# Patient Record
Sex: Female | Born: 1945 | Race: White | Hispanic: No | Marital: Married | Smoking: Former smoker
Health system: Southern US, Community
[De-identification: ages and names within clinical notes are randomized; demographics above are authoritative.]

## PROBLEM LIST (undated history)

## (undated) DIAGNOSIS — I10 Essential (primary) hypertension: Secondary | ICD-10-CM

## (undated) DIAGNOSIS — K219 Gastro-esophageal reflux disease without esophagitis: Secondary | ICD-10-CM

## (undated) DIAGNOSIS — F419 Anxiety disorder, unspecified: Secondary | ICD-10-CM

## (undated) DIAGNOSIS — Z720 Tobacco use: Secondary | ICD-10-CM

## (undated) DIAGNOSIS — J45909 Unspecified asthma, uncomplicated: Secondary | ICD-10-CM

## (undated) DIAGNOSIS — E039 Hypothyroidism, unspecified: Secondary | ICD-10-CM

## (undated) DIAGNOSIS — N289 Disorder of kidney and ureter, unspecified: Secondary | ICD-10-CM

## (undated) DIAGNOSIS — M1990 Unspecified osteoarthritis, unspecified site: Secondary | ICD-10-CM

## (undated) DIAGNOSIS — M109 Gout, unspecified: Secondary | ICD-10-CM

## (undated) HISTORY — PX: TUBAL LIGATION: SHX77

## (undated) HISTORY — DX: Unspecified osteoarthritis, unspecified site: M19.90

## (undated) HISTORY — DX: Tobacco use: Z72.0

## (undated) HISTORY — DX: Unspecified asthma, uncomplicated: J45.909

## (undated) HISTORY — DX: Gastro-esophageal reflux disease without esophagitis: K21.9

## (undated) HISTORY — DX: Essential (primary) hypertension: I10

## (undated) HISTORY — DX: Disorder of kidney and ureter, unspecified: N28.9

## (undated) HISTORY — DX: Anxiety disorder, unspecified: F41.9

## (undated) HISTORY — DX: Gout, unspecified: M10.9

## (undated) HISTORY — DX: Hypothyroidism, unspecified: E03.9

---

## 1986-01-05 HISTORY — PX: OTHER SURGICAL HISTORY: SHX169

## 1990-01-05 HISTORY — PX: SINOSCOPY: SHX187

## 2015-04-02 ENCOUNTER — Telehealth: Payer: Self-pay | Admitting: Allergy and Immunology

## 2015-04-02 NOTE — Telephone Encounter (Signed)
Patient is at home phone until 3:30 today, if after that you will have to call cell phone. Patient has an appointment next week and has an insurance question before she attends her appointment.

## 2015-04-02 NOTE — Telephone Encounter (Signed)
Pt has [employer] - they told her they would pay for testing 100% - i told her to check on her ded

## 2015-04-10 ENCOUNTER — Ambulatory Visit (INDEPENDENT_AMBULATORY_CARE_PROVIDER_SITE_OTHER): Payer: PRIVATE HEALTH INSURANCE | Admitting: Allergy and Immunology

## 2015-04-10 ENCOUNTER — Encounter: Payer: Self-pay | Admitting: Allergy and Immunology

## 2015-04-10 VITALS — BP 122/80 | HR 88 | Temp 98.7°F | Resp 20 | Ht 62.48 in | Wt 170.6 lb

## 2015-04-10 DIAGNOSIS — Z720 Tobacco use: Secondary | ICD-10-CM | POA: Diagnosis not present

## 2015-04-10 DIAGNOSIS — K219 Gastro-esophageal reflux disease without esophagitis: Secondary | ICD-10-CM

## 2015-04-10 DIAGNOSIS — J45909 Unspecified asthma, uncomplicated: Secondary | ICD-10-CM

## 2015-04-10 DIAGNOSIS — J309 Allergic rhinitis, unspecified: Secondary | ICD-10-CM

## 2015-04-10 DIAGNOSIS — H1010 Acute atopic conjunctivitis, unspecified eye: Secondary | ICD-10-CM | POA: Diagnosis not present

## 2015-04-10 DIAGNOSIS — J387 Other diseases of larynx: Secondary | ICD-10-CM | POA: Diagnosis not present

## 2015-04-10 DIAGNOSIS — J449 Chronic obstructive pulmonary disease, unspecified: Secondary | ICD-10-CM | POA: Diagnosis not present

## 2015-04-10 DIAGNOSIS — F17210 Nicotine dependence, cigarettes, uncomplicated: Secondary | ICD-10-CM

## 2015-04-10 MED ORDER — FLUTICASONE FUROATE 100 MCG/ACT IN AEPB: 1.0000 | INHALATION_SPRAY | Freq: Every day | RESPIRATORY_TRACT | Status: DC

## 2015-04-10 MED ORDER — DEXLANSOPRAZOLE 60 MG PO CPDR: DELAYED_RELEASE_CAPSULE | ORAL | Status: DC

## 2015-04-10 NOTE — Progress Notes (Signed)
Dear Sherlyn Lees,  Thank you for referring Janet Dean to the [facility] on 04/10/2015.   Below is a summation of this patient's evaluation and recommendations.  Thank you for your referral. I will keep you informed about this patient's response to treatment.   If you have any questions please to do hestitate to contact me.   Sincerely,  Jiles Prows, MD [place] [facility]   ______________________________________________________________________    NEW PATIENT NOTE  Referring Provider: Sherlyn Lees, FNP Primary Provider: Sherlyn Lees, [place] Date of office visit: 04/10/2015    Subjective:   Chief Complaint:  Janet Dean (DOB: 08/14/1945) is a 70 y.o. female with a chief complaint of Asthma and Nasal Congestion  who presents to the clinic on 04/10/2015 with the following problems:  HPI Comments: Janet Dean presents this clinic in evaluation of respiratory tract problems. She has a long history of respiratory tract problems dating back several decades. She complains about having runny nose and nasal congestion and sneezing along with some runny eyes and recurrent coughing and wheezing. 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 She does better when she is at home on the weekends and she doesn't work but if she goes outside to work in her garden she develops similar symptoms. She's been diagnosed with bronchitis for many years usually during the spring and fall and she has had several pneumonias with her last pneumonia 2 years ago. She thinks that she's had a total of 5 pneumonias. The administration of various antihistamines and Singulair has not helped her. She does use Combivent which does help some of her lower respiratory tract symptoms.  Unfortunately, Janet Dean appears to be intolerant to the administration of  systemic steroids to help treat her respiratory tract inflammation. She was given prednisone about 8 years ago and developed tongue swelling and subsequently was given Kenalog and developed facial swelling and total body rash. She's not had a systemic steroid in many years.  Janet Dean does have a history of reflux and LPR. Presently she's using Zantac 150 mg twice a day but still continues to have regurgitation up into her throat on a common basis. She is not drinking any caffeine or eating any chocolate or drinking any alcohol.  Janet Dean smokes tobacco. Presently she's had 5 cigarettes per day. She has a very long history of tobacco use dating back 40 years.   Past Medical History  Diagnosis Date  . Asthma   . Gout   . Hypothyroidism   . Anxiety   . High blood pressure   . Arthritis   . GERD (gastroesophageal reflux disease)   . Kidney disease   . Tobacco abuse     Past Surgical History  Procedure Laterality Date  . Sinoscopy  1992  . Tubal ligation    . Other surgical history  1988    Tubal Ligation Reversal      Medication List           CALCIUM 600 PO  Take 600 mg by mouth daily.     celecoxib 200 MG capsule  Commonly known as:  CELEBREX  Take 200 mg by mouth daily.     CENTRUM SILVER PO  Take 1 tablet by mouth daily.     cetirizine 10 MG tablet  Commonly known as:  ZYRTEC  Take 10  mg by mouth daily as needed for allergies.     colchicine 0.6 MG tablet  Take 0.6 mg by mouth daily.     COMBIVENT RESPIMAT 20-100 MCG/ACT Aers respimat  Generic drug:  Ipratropium-Albuterol  INHALE ONE PUFF FOUR TIMES DAILY (MAX OF SIX PUFFS IN 24 hours)     IMIPRAMINE HCL PO  Take 200 mg by mouth daily.     levothyroxine 75 MCG tablet  Commonly known as:  SYNTHROID, LEVOTHROID  Take 75 mcg by mouth daily.     losartan 50 MG tablet  Commonly known as:  COZAAR  Take 50 mg by mouth daily.     MUCINEX PO  Take 400 mg by mouth as needed.     ranitidine 150 MG tablet  Commonly  known as:  ZANTAC  Take 150 mg by mouth as needed for heartburn.     simvastatin 10 MG tablet  Commonly known as:  ZOCOR  Take 10 mg by mouth daily.     Vitamin D (Ergocalciferol) 50000 units Caps capsule  Commonly known as:  DRISDOL  Take 50,000 Units by mouth once a week.        Allergies  Allergen Reactions  . Penicillins Anaphylaxis  . Pantoprazole Sodium Other (See Comments)    UNKNOWN  . Prednisone Swelling    Tongue swelling  . Avelox [Moxifloxacin Hcl In Nacl] Swelling and Rash  . Fluoxetine Rash and Other (See Comments)    Wheezing and Rash  . Kenalog [Triamcinolone Acetonide] Swelling and Rash  . Lipitor [Atorvastatin] Rash    Review of systems negative except as noted in HPI / PMHx or noted below:  Review of Systems  Constitutional: Negative.   HENT: Negative.   Eyes: Negative.   Respiratory: Negative.   Cardiovascular: Negative.   Gastrointestinal: Negative.   Genitourinary: Negative.   Musculoskeletal: Negative.   Skin: Negative.   Neurological: Negative.   Endo/Heme/Allergies: Negative.   Psychiatric/Behavioral: Negative.     Family History  Problem Relation Age of Onset  . Diabetes Mother   . Heart disease Mother   . Transient ischemic attack Mother   . Emphysema Father   . Asthma Maternal Grandmother     Social History   Social History  . Marital Status: Married    Spouse Name: N/A  . Number of Children: N/A  . Years of Education: N/A   Occupational History  . Not on file.   Social History Main Topics  . Smoking status: Current Every Day Smoker    Types: Cigarettes  . Smokeless tobacco: Never Used  . Alcohol Use: No  . Drug Use: No  . Sexual Activity: Not on file   Other Topics Concern  . Not on file   Social History Narrative  . 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  HENT:  Head: Normocephalic. Head is without right periorbital erythema and without left periorbital erythema.  Right Ear: Tympanic  membrane, external ear and ear canal normal.  Left Ear: Tympanic membrane, external ear and ear canal normal.  Nose: Nose normal. No mucosal edema or rhinorrhea.  Mouth/Throat: Oropharynx is clear and moist and mucous membranes are normal. No oropharyngeal exudate.  Eyes: Conjunctivae and lids are normal. Pupils are equal, round, and reactive to light.  Neck: Trachea normal. No tracheal deviation present. No thyromegaly present.  Cardiovascular: Normal rate, regular rhythm, S1 normal, S2 normal and normal heart sounds.   No murmur heard. Pulmonary/Chest: Effort normal. No stridor. No tachypnea. No respiratory distress. She has no wheezes. She has no rales. She exhibits no tenderness.  Abdominal: Soft. She exhibits no distension and no mass. There is no hepatosplenomegaly. There is no tenderness. There is no rebound and no guarding.  Musculoskeletal: She exhibits no edema or tenderness.  Lymphadenopathy:       Head (right side): No tonsillar adenopathy present.       Head (left side): No tonsillar adenopathy present.    She has no cervical adenopathy.    She has no axillary adenopathy.  Neurological: She is alert. Gait normal.  Skin: No rash noted. She is not diaphoretic. No erythema. No pallor. Nails show no clubbing.  Psychiatric: Mood and affect normal.     Diagnostics: Allergy skin tests were performed. She demonstrated hypersensitivity to grass, dog, and dust mite  Spirometry was performed and  demonstrated an FEV1 of 1.72 @ 76 % of predicted. Following the administration of nebulized albuterol her FEV1 increased to 1.96 which was an increase of 14%.  The patient had an Asthma Control Test with the following results:  .     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 Allergic rhinoconjunctivitis   3. LPRD (laryngopharyngeal reflux disease)   4. Tobacco smoker, less than 10 cigarettes per day     1. Allergen avoidance measures  2. Use nicotine substitute for tobacco smoke exposure  3. Treat and prevent inflammation:   A. OTC Rhinocort one spray each nostril one time per day  B. Arnuity 100 one inhalation 1 time per day  4. If needed:   A. Combivent 2 inhalations every 4-6 hours if needed  B. OTC antihistamine - Claritin/Zyrtec/Allegra  C. Mucinex  D. nasal saline  5. Treat and prevent reflux:   A. Dexilant 60 one tablet in a.m.  B. OTC ranitidine 150 - 2 tablets in PM  6. Review last chest x-ray report  7. Return to clinic in 4 weeks or earlier if problem  8. Consider immunotherapy  I will have Patrisia utilize the plan mentioned above which includes anti-inflammatory agents for her respiratory tract utilize in a preventative mode and a little more aggressive therapy directed against her reflux. For history of recurrent infections certainly suggests that she may have a problem with her immune system but she has a lot of reasons to have inflammation of her respiratory tract including her tobacco use which obviously needs to be terminated as soon as possible. I did ask her to find a substitute for nicotine that can replace her tobacco smoke exposure. I will see her back in this clinic in 4 weeks or earlier if there is a problem.  Jiles Prows, MD [facility] [place]

## 2015-04-10 NOTE — Patient Instructions (Signed)
  1. Allergen avoidance measures  2. Use nicotine substitute for tobacco smoke exposure  3. Treat and prevent inflammation:   A. OTC Rhinocort one spray each nostril one time per day  B. Arnuity 100 one inhalation 1 time per day  4. If needed:   A. Combivent 2 inhalations every 4-6 hours if needed  B. OTC antihistamine - Claritin/Zyrtec/Allegra  C. Mucinex  D. nasal saline  5. Treat and prevent reflux:   A. Dexilant 60 one tablet in a.m.  B. OTC ranitidine 150 - 2 tablets in PM  6. Review last chest x-ray report  7. Return to clinic in 4 weeks or earlier if problem  8. Consider immunotherapy

## 2015-05-09 ENCOUNTER — Ambulatory Visit: Payer: PRIVATE HEALTH INSURANCE | Admitting: Allergy and Immunology

## 2015-08-20 ENCOUNTER — Telehealth: Payer: Self-pay | Admitting: *Deleted

## 2015-08-20 NOTE — Telephone Encounter (Signed)
Pt just wanted you to know that she did receive her bill, However, she is in the process of an appeal with her insurance. She just didn't want you to think she was ignoring it. She was under the impression that the visit would be covered 100% and she said she is not going to pay $400. She will call with an update once the appeal is done. She wants to make sure she wont be turned over to collections.

## 2016-01-21 DIAGNOSIS — N182 Chronic kidney disease, stage 2 (mild): Secondary | ICD-10-CM | POA: Diagnosis not present

## 2016-01-21 DIAGNOSIS — E785 Hyperlipidemia, unspecified: Secondary | ICD-10-CM | POA: Diagnosis not present

## 2016-01-21 DIAGNOSIS — R748 Abnormal levels of other serum enzymes: Secondary | ICD-10-CM | POA: Diagnosis not present

## 2016-01-21 DIAGNOSIS — I1310 Hypertensive heart and chronic kidney disease without heart failure, with stage 1 through stage 4 chronic kidney disease, or unspecified chronic kidney disease: Secondary | ICD-10-CM | POA: Diagnosis not present

## 2016-01-21 DIAGNOSIS — E559 Vitamin D deficiency, unspecified: Secondary | ICD-10-CM | POA: Diagnosis not present

## 2016-01-21 DIAGNOSIS — Z136 Encounter for screening for cardiovascular disorders: Secondary | ICD-10-CM | POA: Diagnosis not present

## 2016-01-21 DIAGNOSIS — Z1211 Encounter for screening for malignant neoplasm of colon: Secondary | ICD-10-CM | POA: Diagnosis not present

## 2016-01-27 DIAGNOSIS — F17210 Nicotine dependence, cigarettes, uncomplicated: Secondary | ICD-10-CM | POA: Diagnosis not present

## 2016-01-27 DIAGNOSIS — Z136 Encounter for screening for cardiovascular disorders: Secondary | ICD-10-CM | POA: Diagnosis not present

## 2016-02-03 DIAGNOSIS — N182 Chronic kidney disease, stage 2 (mild): Secondary | ICD-10-CM | POA: Diagnosis not present

## 2016-02-03 DIAGNOSIS — K219 Gastro-esophageal reflux disease without esophagitis: Secondary | ICD-10-CM | POA: Diagnosis not present

## 2016-02-03 DIAGNOSIS — J4540 Moderate persistent asthma, uncomplicated: Secondary | ICD-10-CM | POA: Diagnosis not present

## 2016-02-03 DIAGNOSIS — I1310 Hypertensive heart and chronic kidney disease without heart failure, with stage 1 through stage 4 chronic kidney disease, or unspecified chronic kidney disease: Secondary | ICD-10-CM | POA: Diagnosis not present

## 2016-02-03 DIAGNOSIS — E559 Vitamin D deficiency, unspecified: Secondary | ICD-10-CM | POA: Diagnosis not present

## 2016-02-03 DIAGNOSIS — F419 Anxiety disorder, unspecified: Secondary | ICD-10-CM | POA: Diagnosis not present

## 2016-02-03 DIAGNOSIS — J309 Allergic rhinitis, unspecified: Secondary | ICD-10-CM | POA: Diagnosis not present

## 2016-02-03 DIAGNOSIS — E785 Hyperlipidemia, unspecified: Secondary | ICD-10-CM | POA: Diagnosis not present

## 2016-02-03 DIAGNOSIS — E039 Hypothyroidism, unspecified: Secondary | ICD-10-CM | POA: Diagnosis not present

## 2016-02-03 DIAGNOSIS — F5105 Insomnia due to other mental disorder: Secondary | ICD-10-CM | POA: Diagnosis not present

## 2016-02-03 DIAGNOSIS — Z79899 Other long term (current) drug therapy: Secondary | ICD-10-CM | POA: Diagnosis not present

## 2016-02-03 DIAGNOSIS — H903 Sensorineural hearing loss, bilateral: Secondary | ICD-10-CM | POA: Diagnosis not present

## 2016-03-19 DIAGNOSIS — J22 Unspecified acute lower respiratory infection: Secondary | ICD-10-CM | POA: Diagnosis not present

## 2016-06-03 DIAGNOSIS — N182 Chronic kidney disease, stage 2 (mild): Secondary | ICD-10-CM | POA: Diagnosis not present

## 2016-06-03 DIAGNOSIS — J309 Allergic rhinitis, unspecified: Secondary | ICD-10-CM | POA: Diagnosis not present

## 2016-06-03 DIAGNOSIS — E785 Hyperlipidemia, unspecified: Secondary | ICD-10-CM | POA: Diagnosis not present

## 2016-06-03 DIAGNOSIS — G44009 Cluster headache syndrome, unspecified, not intractable: Secondary | ICD-10-CM | POA: Diagnosis not present

## 2016-06-03 DIAGNOSIS — I1310 Hypertensive heart and chronic kidney disease without heart failure, with stage 1 through stage 4 chronic kidney disease, or unspecified chronic kidney disease: Secondary | ICD-10-CM | POA: Diagnosis not present

## 2016-06-03 DIAGNOSIS — R51 Headache: Secondary | ICD-10-CM | POA: Diagnosis not present

## 2016-08-03 DIAGNOSIS — Z79899 Other long term (current) drug therapy: Secondary | ICD-10-CM | POA: Diagnosis not present

## 2016-08-03 DIAGNOSIS — Z0000 Encounter for general adult medical examination without abnormal findings: Secondary | ICD-10-CM | POA: Diagnosis not present

## 2016-08-03 DIAGNOSIS — I1310 Hypertensive heart and chronic kidney disease without heart failure, with stage 1 through stage 4 chronic kidney disease, or unspecified chronic kidney disease: Secondary | ICD-10-CM | POA: Diagnosis not present

## 2016-08-03 DIAGNOSIS — Z9181 History of falling: Secondary | ICD-10-CM | POA: Diagnosis not present

## 2016-08-03 DIAGNOSIS — Z1389 Encounter for screening for other disorder: Secondary | ICD-10-CM | POA: Diagnosis not present

## 2016-08-03 DIAGNOSIS — E039 Hypothyroidism, unspecified: Secondary | ICD-10-CM | POA: Diagnosis not present

## 2016-08-03 DIAGNOSIS — I129 Hypertensive chronic kidney disease with stage 1 through stage 4 chronic kidney disease, or unspecified chronic kidney disease: Secondary | ICD-10-CM | POA: Diagnosis not present

## 2016-08-03 DIAGNOSIS — Z1211 Encounter for screening for malignant neoplasm of colon: Secondary | ICD-10-CM | POA: Diagnosis not present

## 2016-08-03 DIAGNOSIS — E559 Vitamin D deficiency, unspecified: Secondary | ICD-10-CM | POA: Diagnosis not present

## 2016-08-03 DIAGNOSIS — N183 Chronic kidney disease, stage 3 (moderate): Secondary | ICD-10-CM | POA: Diagnosis not present

## 2016-08-03 DIAGNOSIS — Z780 Asymptomatic menopausal state: Secondary | ICD-10-CM | POA: Diagnosis not present

## 2016-08-03 DIAGNOSIS — E785 Hyperlipidemia, unspecified: Secondary | ICD-10-CM | POA: Diagnosis not present

## 2016-08-03 DIAGNOSIS — Z1231 Encounter for screening mammogram for malignant neoplasm of breast: Secondary | ICD-10-CM | POA: Diagnosis not present

## 2016-08-10 DIAGNOSIS — M48061 Spinal stenosis, lumbar region without neurogenic claudication: Secondary | ICD-10-CM | POA: Diagnosis not present

## 2016-08-10 DIAGNOSIS — E039 Hypothyroidism, unspecified: Secondary | ICD-10-CM | POA: Diagnosis not present

## 2016-08-10 DIAGNOSIS — E785 Hyperlipidemia, unspecified: Secondary | ICD-10-CM | POA: Diagnosis not present

## 2016-08-10 DIAGNOSIS — E559 Vitamin D deficiency, unspecified: Secondary | ICD-10-CM | POA: Diagnosis not present

## 2016-08-10 DIAGNOSIS — N183 Chronic kidney disease, stage 3 (moderate): Secondary | ICD-10-CM | POA: Diagnosis not present

## 2016-08-10 DIAGNOSIS — I129 Hypertensive chronic kidney disease with stage 1 through stage 4 chronic kidney disease, or unspecified chronic kidney disease: Secondary | ICD-10-CM | POA: Diagnosis not present

## 2016-08-10 DIAGNOSIS — M25552 Pain in left hip: Secondary | ICD-10-CM | POA: Diagnosis not present

## 2016-08-10 DIAGNOSIS — M1710 Unilateral primary osteoarthritis, unspecified knee: Secondary | ICD-10-CM | POA: Diagnosis not present

## 2016-08-10 DIAGNOSIS — Z79899 Other long term (current) drug therapy: Secondary | ICD-10-CM | POA: Diagnosis not present

## 2016-08-13 DIAGNOSIS — M25552 Pain in left hip: Secondary | ICD-10-CM | POA: Diagnosis not present

## 2016-08-14 DIAGNOSIS — M25462 Effusion, left knee: Secondary | ICD-10-CM | POA: Diagnosis not present

## 2016-08-14 DIAGNOSIS — M25461 Effusion, right knee: Secondary | ICD-10-CM | POA: Diagnosis not present

## 2016-08-14 DIAGNOSIS — M170 Bilateral primary osteoarthritis of knee: Secondary | ICD-10-CM | POA: Diagnosis not present

## 2016-08-14 DIAGNOSIS — Z1231 Encounter for screening mammogram for malignant neoplasm of breast: Secondary | ICD-10-CM | POA: Diagnosis not present

## 2016-08-14 DIAGNOSIS — Z13820 Encounter for screening for osteoporosis: Secondary | ICD-10-CM | POA: Diagnosis not present

## 2016-08-14 DIAGNOSIS — M179 Osteoarthritis of knee, unspecified: Secondary | ICD-10-CM | POA: Diagnosis not present

## 2016-08-14 DIAGNOSIS — Z780 Asymptomatic menopausal state: Secondary | ICD-10-CM | POA: Diagnosis not present

## 2016-09-02 DIAGNOSIS — M1712 Unilateral primary osteoarthritis, left knee: Secondary | ICD-10-CM | POA: Diagnosis not present

## 2016-09-09 DIAGNOSIS — J4540 Moderate persistent asthma, uncomplicated: Secondary | ICD-10-CM | POA: Diagnosis not present

## 2016-09-09 DIAGNOSIS — M1712 Unilateral primary osteoarthritis, left knee: Secondary | ICD-10-CM | POA: Diagnosis not present

## 2016-09-16 DIAGNOSIS — M1712 Unilateral primary osteoarthritis, left knee: Secondary | ICD-10-CM | POA: Diagnosis not present

## 2016-11-02 DIAGNOSIS — M1711 Unilateral primary osteoarthritis, right knee: Secondary | ICD-10-CM | POA: Diagnosis not present

## 2016-11-09 DIAGNOSIS — M1711 Unilateral primary osteoarthritis, right knee: Secondary | ICD-10-CM | POA: Diagnosis not present

## 2016-11-16 DIAGNOSIS — R590 Localized enlarged lymph nodes: Secondary | ICD-10-CM | POA: Diagnosis not present

## 2016-11-16 DIAGNOSIS — M1711 Unilateral primary osteoarthritis, right knee: Secondary | ICD-10-CM | POA: Diagnosis not present

## 2016-11-24 DIAGNOSIS — R590 Localized enlarged lymph nodes: Secondary | ICD-10-CM | POA: Diagnosis not present

## 2016-12-07 DIAGNOSIS — H9201 Otalgia, right ear: Secondary | ICD-10-CM | POA: Diagnosis not present

## 2016-12-07 DIAGNOSIS — R030 Elevated blood-pressure reading, without diagnosis of hypertension: Secondary | ICD-10-CM | POA: Diagnosis not present

## 2016-12-07 DIAGNOSIS — R591 Generalized enlarged lymph nodes: Secondary | ICD-10-CM | POA: Diagnosis not present

## 2017-01-28 DIAGNOSIS — Z1331 Encounter for screening for depression: Secondary | ICD-10-CM | POA: Diagnosis not present

## 2017-01-28 DIAGNOSIS — I129 Hypertensive chronic kidney disease with stage 1 through stage 4 chronic kidney disease, or unspecified chronic kidney disease: Secondary | ICD-10-CM | POA: Diagnosis not present

## 2017-01-28 DIAGNOSIS — Z0000 Encounter for general adult medical examination without abnormal findings: Secondary | ICD-10-CM | POA: Diagnosis not present

## 2017-02-05 DIAGNOSIS — L918 Other hypertrophic disorders of the skin: Secondary | ICD-10-CM | POA: Diagnosis not present

## 2017-02-05 DIAGNOSIS — D225 Melanocytic nevi of trunk: Secondary | ICD-10-CM | POA: Diagnosis not present

## 2017-02-05 DIAGNOSIS — L720 Epidermal cyst: Secondary | ICD-10-CM | POA: Diagnosis not present

## 2017-02-05 DIAGNOSIS — L578 Other skin changes due to chronic exposure to nonionizing radiation: Secondary | ICD-10-CM | POA: Diagnosis not present

## 2017-03-01 DIAGNOSIS — F419 Anxiety disorder, unspecified: Secondary | ICD-10-CM | POA: Diagnosis not present

## 2017-03-01 DIAGNOSIS — I1310 Hypertensive heart and chronic kidney disease without heart failure, with stage 1 through stage 4 chronic kidney disease, or unspecified chronic kidney disease: Secondary | ICD-10-CM | POA: Diagnosis not present

## 2017-03-01 DIAGNOSIS — E039 Hypothyroidism, unspecified: Secondary | ICD-10-CM | POA: Diagnosis not present

## 2017-03-01 DIAGNOSIS — J4540 Moderate persistent asthma, uncomplicated: Secondary | ICD-10-CM | POA: Diagnosis not present

## 2017-03-01 DIAGNOSIS — Z79899 Other long term (current) drug therapy: Secondary | ICD-10-CM | POA: Diagnosis not present

## 2017-03-01 DIAGNOSIS — M109 Gout, unspecified: Secondary | ICD-10-CM | POA: Diagnosis not present

## 2017-03-01 DIAGNOSIS — N182 Chronic kidney disease, stage 2 (mild): Secondary | ICD-10-CM | POA: Diagnosis not present

## 2017-03-01 DIAGNOSIS — F17200 Nicotine dependence, unspecified, uncomplicated: Secondary | ICD-10-CM | POA: Diagnosis not present

## 2017-03-01 DIAGNOSIS — E559 Vitamin D deficiency, unspecified: Secondary | ICD-10-CM | POA: Diagnosis not present

## 2017-03-01 DIAGNOSIS — F17210 Nicotine dependence, cigarettes, uncomplicated: Secondary | ICD-10-CM | POA: Diagnosis not present

## 2017-03-01 DIAGNOSIS — E785 Hyperlipidemia, unspecified: Secondary | ICD-10-CM | POA: Diagnosis not present

## 2017-03-09 DIAGNOSIS — E871 Hypo-osmolality and hyponatremia: Secondary | ICD-10-CM | POA: Diagnosis not present

## 2017-03-09 DIAGNOSIS — R739 Hyperglycemia, unspecified: Secondary | ICD-10-CM | POA: Diagnosis not present

## 2017-03-09 DIAGNOSIS — J9601 Acute respiratory failure with hypoxia: Secondary | ICD-10-CM | POA: Diagnosis not present

## 2017-03-09 DIAGNOSIS — E039 Hypothyroidism, unspecified: Secondary | ICD-10-CM | POA: Diagnosis not present

## 2017-03-09 DIAGNOSIS — I10 Essential (primary) hypertension: Secondary | ICD-10-CM | POA: Diagnosis not present

## 2017-03-09 DIAGNOSIS — R0600 Dyspnea, unspecified: Secondary | ICD-10-CM | POA: Diagnosis not present

## 2017-03-09 DIAGNOSIS — R7989 Other specified abnormal findings of blood chemistry: Secondary | ICD-10-CM | POA: Diagnosis not present

## 2017-03-09 DIAGNOSIS — R0602 Shortness of breath: Secondary | ICD-10-CM | POA: Diagnosis not present

## 2017-03-09 DIAGNOSIS — R945 Abnormal results of liver function studies: Secondary | ICD-10-CM | POA: Diagnosis not present

## 2017-03-09 DIAGNOSIS — J45901 Unspecified asthma with (acute) exacerbation: Secondary | ICD-10-CM | POA: Diagnosis not present

## 2017-03-10 DIAGNOSIS — J9601 Acute respiratory failure with hypoxia: Secondary | ICD-10-CM | POA: Diagnosis not present

## 2017-03-10 DIAGNOSIS — R0600 Dyspnea, unspecified: Secondary | ICD-10-CM | POA: Diagnosis not present

## 2017-03-10 DIAGNOSIS — Z716 Tobacco abuse counseling: Secondary | ICD-10-CM | POA: Diagnosis not present

## 2017-03-10 DIAGNOSIS — E039 Hypothyroidism, unspecified: Secondary | ICD-10-CM | POA: Diagnosis not present

## 2017-03-10 DIAGNOSIS — R739 Hyperglycemia, unspecified: Secondary | ICD-10-CM | POA: Diagnosis not present

## 2017-03-10 DIAGNOSIS — J45901 Unspecified asthma with (acute) exacerbation: Secondary | ICD-10-CM | POA: Diagnosis not present

## 2017-03-10 DIAGNOSIS — F17210 Nicotine dependence, cigarettes, uncomplicated: Secondary | ICD-10-CM | POA: Diagnosis not present

## 2017-03-10 DIAGNOSIS — E785 Hyperlipidemia, unspecified: Secondary | ICD-10-CM | POA: Diagnosis not present

## 2017-03-10 DIAGNOSIS — R0602 Shortness of breath: Secondary | ICD-10-CM | POA: Diagnosis not present

## 2017-03-10 DIAGNOSIS — E871 Hypo-osmolality and hyponatremia: Secondary | ICD-10-CM | POA: Diagnosis not present

## 2017-03-10 DIAGNOSIS — I10 Essential (primary) hypertension: Secondary | ICD-10-CM | POA: Diagnosis not present

## 2017-03-17 DIAGNOSIS — R918 Other nonspecific abnormal finding of lung field: Secondary | ICD-10-CM | POA: Diagnosis not present

## 2017-03-17 DIAGNOSIS — J45909 Unspecified asthma, uncomplicated: Secondary | ICD-10-CM | POA: Diagnosis not present

## 2017-03-17 DIAGNOSIS — J4540 Moderate persistent asthma, uncomplicated: Secondary | ICD-10-CM | POA: Diagnosis not present

## 2017-03-17 DIAGNOSIS — J4550 Severe persistent asthma, uncomplicated: Secondary | ICD-10-CM | POA: Diagnosis not present

## 2017-03-31 DIAGNOSIS — R05 Cough: Secondary | ICD-10-CM | POA: Diagnosis not present

## 2017-03-31 DIAGNOSIS — J181 Lobar pneumonia, unspecified organism: Secondary | ICD-10-CM | POA: Diagnosis not present

## 2017-04-13 DIAGNOSIS — J9601 Acute respiratory failure with hypoxia: Secondary | ICD-10-CM | POA: Diagnosis not present

## 2017-04-27 DIAGNOSIS — Z23 Encounter for immunization: Secondary | ICD-10-CM | POA: Diagnosis not present

## 2017-04-27 DIAGNOSIS — E785 Hyperlipidemia, unspecified: Secondary | ICD-10-CM | POA: Diagnosis not present

## 2017-04-27 DIAGNOSIS — E039 Hypothyroidism, unspecified: Secondary | ICD-10-CM | POA: Diagnosis not present

## 2017-05-11 DIAGNOSIS — H43812 Vitreous degeneration, left eye: Secondary | ICD-10-CM | POA: Diagnosis not present

## 2017-05-11 DIAGNOSIS — H35362 Drusen (degenerative) of macula, left eye: Secondary | ICD-10-CM | POA: Diagnosis not present

## 2017-05-11 DIAGNOSIS — H25813 Combined forms of age-related cataract, bilateral: Secondary | ICD-10-CM | POA: Diagnosis not present

## 2017-06-01 DIAGNOSIS — H43812 Vitreous degeneration, left eye: Secondary | ICD-10-CM | POA: Diagnosis not present

## 2017-06-08 DIAGNOSIS — S82892A Other fracture of left lower leg, initial encounter for closed fracture: Secondary | ICD-10-CM | POA: Diagnosis not present

## 2017-06-08 DIAGNOSIS — M25461 Effusion, right knee: Secondary | ICD-10-CM | POA: Diagnosis not present

## 2017-06-08 DIAGNOSIS — R609 Edema, unspecified: Secondary | ICD-10-CM | POA: Diagnosis not present

## 2017-06-08 DIAGNOSIS — S82851A Displaced trimalleolar fracture of right lower leg, initial encounter for closed fracture: Secondary | ICD-10-CM | POA: Diagnosis not present

## 2017-06-08 DIAGNOSIS — W19XXXA Unspecified fall, initial encounter: Secondary | ICD-10-CM | POA: Diagnosis not present

## 2017-06-08 DIAGNOSIS — M25571 Pain in right ankle and joints of right foot: Secondary | ICD-10-CM | POA: Diagnosis not present

## 2017-06-08 DIAGNOSIS — R52 Pain, unspecified: Secondary | ICD-10-CM | POA: Diagnosis not present

## 2017-06-10 DIAGNOSIS — R269 Unspecified abnormalities of gait and mobility: Secondary | ICD-10-CM | POA: Diagnosis not present

## 2017-06-10 DIAGNOSIS — S82851A Displaced trimalleolar fracture of right lower leg, initial encounter for closed fracture: Secondary | ICD-10-CM | POA: Diagnosis not present

## 2017-06-11 ENCOUNTER — Other Ambulatory Visit: Payer: Self-pay

## 2017-06-11 NOTE — Patient Outreach (Signed)
[facility]) Care Management  06/11/2017  Janet Dean [birth year] [id number]   Telephone Screen  Referral Date: 06/10/17 Referral Source: [facility] UM Dept Referral Reason: "please call member on/after 06/11/17, her ortho follow up is 06/10/17, member has a broken ankle, she lives alone, she does not know the current treatment until her ortho follow up, is using a borrowed wheelchair to get around, unable to use walker or the crutches, may need home health assistance depending on ortho visit" Insurance:   Outreach attempt # 1 to patient. Spoke with patient and screening completed.    Social: Patient resides in her home alone. She voices that since her injury her two dtrs are rotating and staying to provide care to her. She states that she has a broken ankle and currently is in a cast. She was told that she could not have surgery until swelling goes down. Patient reports that she has been told to stay on bedrest to keep weight/pressure off leg/ankle. She voices that her dtrs are assisting and providing personal care to her. Patient inquiring about [place] aide to help with personal care. Patient advised that [facility] does not provide these services. Advised patient to contact her MD to see if she is eligible for [facility] services. Patient voices that she is currently using a wheelchair-it is borrowed. She was not interested in obtaining a new one. She voices that her dtr has ordered her a rollator and does not need anything else at this time.    Conditions: Patient has PMH of tobacco abuse, GERD, asthma, gout, hypothyroidism, HTN,arthritis and kidney disease.Patient denies any issues or concerns managing her medical conditions and does not need assistance with them.   Medications: Patient voices that she is managing her meds on her own. She denies any issues or concerns at this time managing her meds.     Appointments: Patient voices that she goes to see ortho MD again next Wednesday and possibly may  have surgery the end of next week.   Consent: [facility] services reviewed with patient. She does not have nay [facility] needs at this time. Patient only interested in in home care aide services.   Plan: RN CM will close case at this time. RN CM will send [facility] brochure to patient for future reference.   Enzo Montgomery, RN,BSN,CCM [place] Management Telephonic Care Management Coordinator Direct Phone: [phone] Toll Free: [phone] Fax: [phone]

## 2017-06-16 DIAGNOSIS — S82851A Displaced trimalleolar fracture of right lower leg, initial encounter for closed fracture: Secondary | ICD-10-CM | POA: Diagnosis not present

## 2017-06-22 DIAGNOSIS — J449 Chronic obstructive pulmonary disease, unspecified: Secondary | ICD-10-CM | POA: Diagnosis not present

## 2017-06-22 DIAGNOSIS — M109 Gout, unspecified: Secondary | ICD-10-CM | POA: Diagnosis not present

## 2017-06-22 DIAGNOSIS — K219 Gastro-esophageal reflux disease without esophagitis: Secondary | ICD-10-CM | POA: Diagnosis not present

## 2017-06-22 DIAGNOSIS — F419 Anxiety disorder, unspecified: Secondary | ICD-10-CM | POA: Diagnosis not present

## 2017-06-22 DIAGNOSIS — I1310 Hypertensive heart and chronic kidney disease without heart failure, with stage 1 through stage 4 chronic kidney disease, or unspecified chronic kidney disease: Secondary | ICD-10-CM | POA: Diagnosis not present

## 2017-06-22 DIAGNOSIS — N182 Chronic kidney disease, stage 2 (mild): Secondary | ICD-10-CM | POA: Diagnosis not present

## 2017-06-22 DIAGNOSIS — Z79899 Other long term (current) drug therapy: Secondary | ICD-10-CM | POA: Diagnosis not present

## 2017-06-22 DIAGNOSIS — J4540 Moderate persistent asthma, uncomplicated: Secondary | ICD-10-CM | POA: Diagnosis not present

## 2017-06-22 DIAGNOSIS — S82851A Displaced trimalleolar fracture of right lower leg, initial encounter for closed fracture: Secondary | ICD-10-CM | POA: Diagnosis not present

## 2017-06-22 DIAGNOSIS — E039 Hypothyroidism, unspecified: Secondary | ICD-10-CM | POA: Diagnosis not present

## 2017-06-22 DIAGNOSIS — E559 Vitamin D deficiency, unspecified: Secondary | ICD-10-CM | POA: Diagnosis not present

## 2017-06-22 DIAGNOSIS — G8918 Other acute postprocedural pain: Secondary | ICD-10-CM | POA: Diagnosis not present

## 2017-06-22 DIAGNOSIS — F17210 Nicotine dependence, cigarettes, uncomplicated: Secondary | ICD-10-CM | POA: Diagnosis not present

## 2017-06-22 DIAGNOSIS — E785 Hyperlipidemia, unspecified: Secondary | ICD-10-CM | POA: Diagnosis not present

## 2017-06-24 DIAGNOSIS — F419 Anxiety disorder, unspecified: Secondary | ICD-10-CM | POA: Diagnosis not present

## 2017-06-24 DIAGNOSIS — K219 Gastro-esophageal reflux disease without esophagitis: Secondary | ICD-10-CM | POA: Diagnosis not present

## 2017-06-24 DIAGNOSIS — E790 Hyperuricemia without signs of inflammatory arthritis and tophaceous disease: Secondary | ICD-10-CM | POA: Diagnosis not present

## 2017-06-24 DIAGNOSIS — R269 Unspecified abnormalities of gait and mobility: Secondary | ICD-10-CM | POA: Diagnosis not present

## 2017-06-24 DIAGNOSIS — H903 Sensorineural hearing loss, bilateral: Secondary | ICD-10-CM | POA: Diagnosis not present

## 2017-06-24 DIAGNOSIS — S82851D Displaced trimalleolar fracture of right lower leg, subsequent encounter for closed fracture with routine healing: Secondary | ICD-10-CM | POA: Diagnosis not present

## 2017-07-02 DIAGNOSIS — H903 Sensorineural hearing loss, bilateral: Secondary | ICD-10-CM | POA: Diagnosis not present

## 2017-07-02 DIAGNOSIS — M109 Gout, unspecified: Secondary | ICD-10-CM | POA: Diagnosis not present

## 2017-07-02 DIAGNOSIS — G8929 Other chronic pain: Secondary | ICD-10-CM | POA: Diagnosis not present

## 2017-07-02 DIAGNOSIS — E039 Hypothyroidism, unspecified: Secondary | ICD-10-CM | POA: Diagnosis not present

## 2017-07-02 DIAGNOSIS — M170 Bilateral primary osteoarthritis of knee: Secondary | ICD-10-CM | POA: Diagnosis not present

## 2017-07-02 DIAGNOSIS — Z9181 History of falling: Secondary | ICD-10-CM | POA: Diagnosis not present

## 2017-07-02 DIAGNOSIS — M48062 Spinal stenosis, lumbar region with neurogenic claudication: Secondary | ICD-10-CM | POA: Diagnosis not present

## 2017-07-02 DIAGNOSIS — M545 Low back pain: Secondary | ICD-10-CM | POA: Diagnosis not present

## 2017-07-02 DIAGNOSIS — K219 Gastro-esophageal reflux disease without esophagitis: Secondary | ICD-10-CM | POA: Diagnosis not present

## 2017-07-02 DIAGNOSIS — G44009 Cluster headache syndrome, unspecified, not intractable: Secondary | ICD-10-CM | POA: Diagnosis not present

## 2017-07-02 DIAGNOSIS — J309 Allergic rhinitis, unspecified: Secondary | ICD-10-CM | POA: Diagnosis not present

## 2017-07-02 DIAGNOSIS — Z8701 Personal history of pneumonia (recurrent): Secondary | ICD-10-CM | POA: Diagnosis not present

## 2017-07-02 DIAGNOSIS — S82431D Displaced oblique fracture of shaft of right fibula, subsequent encounter for closed fracture with routine healing: Secondary | ICD-10-CM | POA: Diagnosis not present

## 2017-07-02 DIAGNOSIS — Z87891 Personal history of nicotine dependence: Secondary | ICD-10-CM | POA: Diagnosis not present

## 2017-07-02 DIAGNOSIS — F419 Anxiety disorder, unspecified: Secondary | ICD-10-CM | POA: Diagnosis not present

## 2017-07-02 DIAGNOSIS — F17210 Nicotine dependence, cigarettes, uncomplicated: Secondary | ICD-10-CM | POA: Diagnosis not present

## 2017-07-02 DIAGNOSIS — E559 Vitamin D deficiency, unspecified: Secondary | ICD-10-CM | POA: Diagnosis not present

## 2017-07-02 DIAGNOSIS — S82851D Displaced trimalleolar fracture of right lower leg, subsequent encounter for closed fracture with routine healing: Secondary | ICD-10-CM | POA: Diagnosis not present

## 2017-07-02 DIAGNOSIS — J4540 Moderate persistent asthma, uncomplicated: Secondary | ICD-10-CM | POA: Diagnosis not present

## 2017-07-02 DIAGNOSIS — N182 Chronic kidney disease, stage 2 (mild): Secondary | ICD-10-CM | POA: Diagnosis not present

## 2017-07-02 DIAGNOSIS — I1310 Hypertensive heart and chronic kidney disease without heart failure, with stage 1 through stage 4 chronic kidney disease, or unspecified chronic kidney disease: Secondary | ICD-10-CM | POA: Diagnosis not present

## 2017-07-09 ENCOUNTER — Other Ambulatory Visit: Payer: Self-pay

## 2017-07-09 NOTE — Patient Outreach (Signed)
[facility]) Care Management  07/09/2017  Janet Dean 1945-04-22 [id number]   Referral Date: 07/09/17 Referral Source: [facility] report Date of Admission: 06/24/17 Diagnosis: Right leg fracture Date of Discharge: 07/01/17 Facility:  [place]: HTA  Outreach attempt # 1 Spoke with patient.  She is able to verify HIPAA. Patient reports she is doing well.  She states that she saw the surgeon on 07-07-17 and has a hard cast on her right leg now.  She states that she is non weight bearing but uses her wheelchair and walker to get through the house.    Social: Patient lives alone but has daughters that check on her frequently and assist her.    Conditions: Patient recently had a fracture to right leg and ankle from a fall. She states that she went to [facility] for 7 days for rehab and has done well.    Medications: Patient reports that she takes her medications but unable to review on call.    Appointments: Patient has seen surgeon and will see her PCP on Monday.  She states that one of her daughters will take her.   Consent: RN CM reviewed [facility] services with patient.  Patient declines needing services at this time.    Plan: RN CM will close case.     Jone Baseman, RN, MSN [facility] Care Management Care Management Coordinator Direct Line [phone] Toll Free: [phone]  Fax: [phone]

## 2017-07-12 DIAGNOSIS — E559 Vitamin D deficiency, unspecified: Secondary | ICD-10-CM | POA: Diagnosis not present

## 2017-07-12 DIAGNOSIS — S82851D Displaced trimalleolar fracture of right lower leg, subsequent encounter for closed fracture with routine healing: Secondary | ICD-10-CM | POA: Diagnosis not present

## 2017-07-26 DIAGNOSIS — N182 Chronic kidney disease, stage 2 (mild): Secondary | ICD-10-CM | POA: Diagnosis not present

## 2017-07-26 DIAGNOSIS — E782 Mixed hyperlipidemia: Secondary | ICD-10-CM | POA: Diagnosis not present

## 2017-07-26 DIAGNOSIS — I1310 Hypertensive heart and chronic kidney disease without heart failure, with stage 1 through stage 4 chronic kidney disease, or unspecified chronic kidney disease: Secondary | ICD-10-CM | POA: Diagnosis not present

## 2017-07-26 DIAGNOSIS — F419 Anxiety disorder, unspecified: Secondary | ICD-10-CM | POA: Diagnosis not present

## 2017-07-26 DIAGNOSIS — E039 Hypothyroidism, unspecified: Secondary | ICD-10-CM | POA: Diagnosis not present

## 2017-07-29 DIAGNOSIS — S82851D Displaced trimalleolar fracture of right lower leg, subsequent encounter for closed fracture with routine healing: Secondary | ICD-10-CM | POA: Diagnosis not present

## 2017-08-04 DIAGNOSIS — S82851D Displaced trimalleolar fracture of right lower leg, subsequent encounter for closed fracture with routine healing: Secondary | ICD-10-CM | POA: Diagnosis not present

## 2017-08-05 DIAGNOSIS — S82851D Displaced trimalleolar fracture of right lower leg, subsequent encounter for closed fracture with routine healing: Secondary | ICD-10-CM | POA: Diagnosis not present

## 2017-08-10 DIAGNOSIS — R2689 Other abnormalities of gait and mobility: Secondary | ICD-10-CM | POA: Diagnosis not present

## 2017-08-10 DIAGNOSIS — M6281 Muscle weakness (generalized): Secondary | ICD-10-CM | POA: Diagnosis not present

## 2017-08-10 DIAGNOSIS — S82851D Displaced trimalleolar fracture of right lower leg, subsequent encounter for closed fracture with routine healing: Secondary | ICD-10-CM | POA: Diagnosis not present

## 2017-08-10 DIAGNOSIS — M25571 Pain in right ankle and joints of right foot: Secondary | ICD-10-CM | POA: Diagnosis not present

## 2017-08-12 DIAGNOSIS — M6281 Muscle weakness (generalized): Secondary | ICD-10-CM | POA: Diagnosis not present

## 2017-08-12 DIAGNOSIS — S82851D Displaced trimalleolar fracture of right lower leg, subsequent encounter for closed fracture with routine healing: Secondary | ICD-10-CM | POA: Diagnosis not present

## 2017-08-12 DIAGNOSIS — M25571 Pain in right ankle and joints of right foot: Secondary | ICD-10-CM | POA: Diagnosis not present

## 2017-08-12 DIAGNOSIS — R2689 Other abnormalities of gait and mobility: Secondary | ICD-10-CM | POA: Diagnosis not present

## 2017-08-16 DIAGNOSIS — R2689 Other abnormalities of gait and mobility: Secondary | ICD-10-CM | POA: Diagnosis not present

## 2017-08-16 DIAGNOSIS — S82851D Displaced trimalleolar fracture of right lower leg, subsequent encounter for closed fracture with routine healing: Secondary | ICD-10-CM | POA: Diagnosis not present

## 2017-08-16 DIAGNOSIS — M6281 Muscle weakness (generalized): Secondary | ICD-10-CM | POA: Diagnosis not present

## 2017-08-16 DIAGNOSIS — M25571 Pain in right ankle and joints of right foot: Secondary | ICD-10-CM | POA: Diagnosis not present

## 2017-08-19 DIAGNOSIS — R2689 Other abnormalities of gait and mobility: Secondary | ICD-10-CM | POA: Diagnosis not present

## 2017-08-19 DIAGNOSIS — M6281 Muscle weakness (generalized): Secondary | ICD-10-CM | POA: Diagnosis not present

## 2017-08-19 DIAGNOSIS — S82851D Displaced trimalleolar fracture of right lower leg, subsequent encounter for closed fracture with routine healing: Secondary | ICD-10-CM | POA: Diagnosis not present

## 2017-08-19 DIAGNOSIS — M25571 Pain in right ankle and joints of right foot: Secondary | ICD-10-CM | POA: Diagnosis not present

## 2017-08-23 DIAGNOSIS — M25571 Pain in right ankle and joints of right foot: Secondary | ICD-10-CM | POA: Diagnosis not present

## 2017-08-23 DIAGNOSIS — M6281 Muscle weakness (generalized): Secondary | ICD-10-CM | POA: Diagnosis not present

## 2017-08-23 DIAGNOSIS — S82851D Displaced trimalleolar fracture of right lower leg, subsequent encounter for closed fracture with routine healing: Secondary | ICD-10-CM | POA: Diagnosis not present

## 2017-08-23 DIAGNOSIS — R2689 Other abnormalities of gait and mobility: Secondary | ICD-10-CM | POA: Diagnosis not present

## 2017-08-26 DIAGNOSIS — M25571 Pain in right ankle and joints of right foot: Secondary | ICD-10-CM | POA: Diagnosis not present

## 2017-08-26 DIAGNOSIS — M6281 Muscle weakness (generalized): Secondary | ICD-10-CM | POA: Diagnosis not present

## 2017-08-26 DIAGNOSIS — S82851D Displaced trimalleolar fracture of right lower leg, subsequent encounter for closed fracture with routine healing: Secondary | ICD-10-CM | POA: Diagnosis not present

## 2017-08-26 DIAGNOSIS — R2689 Other abnormalities of gait and mobility: Secondary | ICD-10-CM | POA: Diagnosis not present

## 2017-08-30 DIAGNOSIS — M25571 Pain in right ankle and joints of right foot: Secondary | ICD-10-CM | POA: Diagnosis not present

## 2017-08-30 DIAGNOSIS — S82851D Displaced trimalleolar fracture of right lower leg, subsequent encounter for closed fracture with routine healing: Secondary | ICD-10-CM | POA: Diagnosis not present

## 2017-08-30 DIAGNOSIS — M6281 Muscle weakness (generalized): Secondary | ICD-10-CM | POA: Diagnosis not present

## 2017-08-30 DIAGNOSIS — R2689 Other abnormalities of gait and mobility: Secondary | ICD-10-CM | POA: Diagnosis not present

## 2017-09-02 DIAGNOSIS — S82851D Displaced trimalleolar fracture of right lower leg, subsequent encounter for closed fracture with routine healing: Secondary | ICD-10-CM | POA: Diagnosis not present

## 2017-09-02 DIAGNOSIS — R2689 Other abnormalities of gait and mobility: Secondary | ICD-10-CM | POA: Diagnosis not present

## 2017-09-02 DIAGNOSIS — M6281 Muscle weakness (generalized): Secondary | ICD-10-CM | POA: Diagnosis not present

## 2017-09-02 DIAGNOSIS — M25571 Pain in right ankle and joints of right foot: Secondary | ICD-10-CM | POA: Diagnosis not present

## 2017-09-08 DIAGNOSIS — S82851D Displaced trimalleolar fracture of right lower leg, subsequent encounter for closed fracture with routine healing: Secondary | ICD-10-CM | POA: Diagnosis not present

## 2017-09-08 DIAGNOSIS — M6281 Muscle weakness (generalized): Secondary | ICD-10-CM | POA: Diagnosis not present

## 2017-09-08 DIAGNOSIS — M25571 Pain in right ankle and joints of right foot: Secondary | ICD-10-CM | POA: Diagnosis not present

## 2017-09-08 DIAGNOSIS — R2689 Other abnormalities of gait and mobility: Secondary | ICD-10-CM | POA: Diagnosis not present

## 2017-09-10 DIAGNOSIS — M6281 Muscle weakness (generalized): Secondary | ICD-10-CM | POA: Diagnosis not present

## 2017-09-10 DIAGNOSIS — S82851D Displaced trimalleolar fracture of right lower leg, subsequent encounter for closed fracture with routine healing: Secondary | ICD-10-CM | POA: Diagnosis not present

## 2017-09-10 DIAGNOSIS — R2689 Other abnormalities of gait and mobility: Secondary | ICD-10-CM | POA: Diagnosis not present

## 2017-09-10 DIAGNOSIS — M25571 Pain in right ankle and joints of right foot: Secondary | ICD-10-CM | POA: Diagnosis not present

## 2017-09-14 DIAGNOSIS — R2689 Other abnormalities of gait and mobility: Secondary | ICD-10-CM | POA: Diagnosis not present

## 2017-09-14 DIAGNOSIS — M6281 Muscle weakness (generalized): Secondary | ICD-10-CM | POA: Diagnosis not present

## 2017-09-14 DIAGNOSIS — M25571 Pain in right ankle and joints of right foot: Secondary | ICD-10-CM | POA: Diagnosis not present

## 2017-09-14 DIAGNOSIS — S82851D Displaced trimalleolar fracture of right lower leg, subsequent encounter for closed fracture with routine healing: Secondary | ICD-10-CM | POA: Diagnosis not present

## 2017-09-15 DIAGNOSIS — S82851D Displaced trimalleolar fracture of right lower leg, subsequent encounter for closed fracture with routine healing: Secondary | ICD-10-CM | POA: Diagnosis not present

## 2017-09-16 DIAGNOSIS — M6281 Muscle weakness (generalized): Secondary | ICD-10-CM | POA: Diagnosis not present

## 2017-09-16 DIAGNOSIS — M25571 Pain in right ankle and joints of right foot: Secondary | ICD-10-CM | POA: Diagnosis not present

## 2017-09-16 DIAGNOSIS — R2689 Other abnormalities of gait and mobility: Secondary | ICD-10-CM | POA: Diagnosis not present

## 2017-09-16 DIAGNOSIS — S82851D Displaced trimalleolar fracture of right lower leg, subsequent encounter for closed fracture with routine healing: Secondary | ICD-10-CM | POA: Diagnosis not present

## 2017-09-20 DIAGNOSIS — R2689 Other abnormalities of gait and mobility: Secondary | ICD-10-CM | POA: Diagnosis not present

## 2017-09-20 DIAGNOSIS — M6281 Muscle weakness (generalized): Secondary | ICD-10-CM | POA: Diagnosis not present

## 2017-09-20 DIAGNOSIS — S82851D Displaced trimalleolar fracture of right lower leg, subsequent encounter for closed fracture with routine healing: Secondary | ICD-10-CM | POA: Diagnosis not present

## 2017-09-20 DIAGNOSIS — M25571 Pain in right ankle and joints of right foot: Secondary | ICD-10-CM | POA: Diagnosis not present

## 2017-09-23 DIAGNOSIS — R2689 Other abnormalities of gait and mobility: Secondary | ICD-10-CM | POA: Diagnosis not present

## 2017-09-23 DIAGNOSIS — M25571 Pain in right ankle and joints of right foot: Secondary | ICD-10-CM | POA: Diagnosis not present

## 2017-09-23 DIAGNOSIS — M6281 Muscle weakness (generalized): Secondary | ICD-10-CM | POA: Diagnosis not present

## 2017-09-23 DIAGNOSIS — S82851D Displaced trimalleolar fracture of right lower leg, subsequent encounter for closed fracture with routine healing: Secondary | ICD-10-CM | POA: Diagnosis not present

## 2017-09-27 DIAGNOSIS — Z0000 Encounter for general adult medical examination without abnormal findings: Secondary | ICD-10-CM | POA: Diagnosis not present

## 2017-09-27 DIAGNOSIS — E559 Vitamin D deficiency, unspecified: Secondary | ICD-10-CM | POA: Diagnosis not present

## 2017-09-27 DIAGNOSIS — Z79899 Other long term (current) drug therapy: Secondary | ICD-10-CM | POA: Diagnosis not present

## 2017-09-27 DIAGNOSIS — K219 Gastro-esophageal reflux disease without esophagitis: Secondary | ICD-10-CM | POA: Diagnosis not present

## 2017-09-27 DIAGNOSIS — I1310 Hypertensive heart and chronic kidney disease without heart failure, with stage 1 through stage 4 chronic kidney disease, or unspecified chronic kidney disease: Secondary | ICD-10-CM | POA: Diagnosis not present

## 2017-09-27 DIAGNOSIS — Z23 Encounter for immunization: Secondary | ICD-10-CM | POA: Diagnosis not present

## 2017-09-27 DIAGNOSIS — N183 Chronic kidney disease, stage 3 (moderate): Secondary | ICD-10-CM | POA: Diagnosis not present

## 2017-09-27 DIAGNOSIS — E782 Mixed hyperlipidemia: Secondary | ICD-10-CM | POA: Diagnosis not present

## 2017-09-27 DIAGNOSIS — N189 Chronic kidney disease, unspecified: Secondary | ICD-10-CM | POA: Diagnosis not present

## 2017-09-27 DIAGNOSIS — E039 Hypothyroidism, unspecified: Secondary | ICD-10-CM | POA: Diagnosis not present

## 2017-09-30 DIAGNOSIS — M6281 Muscle weakness (generalized): Secondary | ICD-10-CM | POA: Diagnosis not present

## 2017-09-30 DIAGNOSIS — S82851D Displaced trimalleolar fracture of right lower leg, subsequent encounter for closed fracture with routine healing: Secondary | ICD-10-CM | POA: Diagnosis not present

## 2017-09-30 DIAGNOSIS — R2689 Other abnormalities of gait and mobility: Secondary | ICD-10-CM | POA: Diagnosis not present

## 2017-09-30 DIAGNOSIS — M25571 Pain in right ankle and joints of right foot: Secondary | ICD-10-CM | POA: Diagnosis not present

## 2017-10-05 DIAGNOSIS — S82851D Displaced trimalleolar fracture of right lower leg, subsequent encounter for closed fracture with routine healing: Secondary | ICD-10-CM | POA: Diagnosis not present

## 2017-10-05 DIAGNOSIS — M6281 Muscle weakness (generalized): Secondary | ICD-10-CM | POA: Diagnosis not present

## 2017-10-05 DIAGNOSIS — M25571 Pain in right ankle and joints of right foot: Secondary | ICD-10-CM | POA: Diagnosis not present

## 2017-10-05 DIAGNOSIS — R2689 Other abnormalities of gait and mobility: Secondary | ICD-10-CM | POA: Diagnosis not present

## 2017-10-11 DIAGNOSIS — M6281 Muscle weakness (generalized): Secondary | ICD-10-CM | POA: Diagnosis not present

## 2017-10-11 DIAGNOSIS — S82851D Displaced trimalleolar fracture of right lower leg, subsequent encounter for closed fracture with routine healing: Secondary | ICD-10-CM | POA: Diagnosis not present

## 2017-10-11 DIAGNOSIS — M25571 Pain in right ankle and joints of right foot: Secondary | ICD-10-CM | POA: Diagnosis not present

## 2017-10-11 DIAGNOSIS — R2689 Other abnormalities of gait and mobility: Secondary | ICD-10-CM | POA: Diagnosis not present

## 2017-10-20 DIAGNOSIS — M25571 Pain in right ankle and joints of right foot: Secondary | ICD-10-CM | POA: Diagnosis not present

## 2017-10-20 DIAGNOSIS — M25671 Stiffness of right ankle, not elsewhere classified: Secondary | ICD-10-CM | POA: Diagnosis not present

## 2017-10-20 DIAGNOSIS — M6281 Muscle weakness (generalized): Secondary | ICD-10-CM | POA: Diagnosis not present

## 2017-10-20 DIAGNOSIS — R2689 Other abnormalities of gait and mobility: Secondary | ICD-10-CM | POA: Diagnosis not present

## 2017-10-27 DIAGNOSIS — S82851D Displaced trimalleolar fracture of right lower leg, subsequent encounter for closed fracture with routine healing: Secondary | ICD-10-CM | POA: Diagnosis not present

## 2017-11-02 DIAGNOSIS — M6281 Muscle weakness (generalized): Secondary | ICD-10-CM | POA: Diagnosis not present

## 2017-11-02 DIAGNOSIS — M25571 Pain in right ankle and joints of right foot: Secondary | ICD-10-CM | POA: Diagnosis not present

## 2017-11-02 DIAGNOSIS — M25671 Stiffness of right ankle, not elsewhere classified: Secondary | ICD-10-CM | POA: Diagnosis not present

## 2017-11-02 DIAGNOSIS — R2689 Other abnormalities of gait and mobility: Secondary | ICD-10-CM | POA: Diagnosis not present

## 2017-11-02 DIAGNOSIS — Z1231 Encounter for screening mammogram for malignant neoplasm of breast: Secondary | ICD-10-CM | POA: Diagnosis not present

## 2017-11-10 DIAGNOSIS — M1711 Unilateral primary osteoarthritis, right knee: Secondary | ICD-10-CM | POA: Diagnosis not present

## 2017-11-23 DIAGNOSIS — Z01419 Encounter for gynecological examination (general) (routine) without abnormal findings: Secondary | ICD-10-CM | POA: Diagnosis not present

## 2017-11-26 DIAGNOSIS — M1711 Unilateral primary osteoarthritis, right knee: Secondary | ICD-10-CM | POA: Diagnosis not present

## 2017-11-30 DIAGNOSIS — J329 Chronic sinusitis, unspecified: Secondary | ICD-10-CM | POA: Diagnosis not present

## 2017-11-30 DIAGNOSIS — J40 Bronchitis, not specified as acute or chronic: Secondary | ICD-10-CM | POA: Diagnosis not present

## 2017-12-17 DIAGNOSIS — R0602 Shortness of breath: Secondary | ICD-10-CM | POA: Diagnosis not present

## 2017-12-17 DIAGNOSIS — J40 Bronchitis, not specified as acute or chronic: Secondary | ICD-10-CM | POA: Diagnosis not present

## 2018-01-10 DIAGNOSIS — Z87891 Personal history of nicotine dependence: Secondary | ICD-10-CM | POA: Diagnosis not present

## 2018-01-10 DIAGNOSIS — J449 Chronic obstructive pulmonary disease, unspecified: Secondary | ICD-10-CM | POA: Diagnosis not present

## 2018-01-10 DIAGNOSIS — J309 Allergic rhinitis, unspecified: Secondary | ICD-10-CM | POA: Diagnosis not present

## 2018-01-18 DIAGNOSIS — J4540 Moderate persistent asthma, uncomplicated: Secondary | ICD-10-CM | POA: Diagnosis not present

## 2018-01-31 DIAGNOSIS — E039 Hypothyroidism, unspecified: Secondary | ICD-10-CM | POA: Diagnosis not present

## 2018-01-31 DIAGNOSIS — E559 Vitamin D deficiency, unspecified: Secondary | ICD-10-CM | POA: Diagnosis not present

## 2018-01-31 DIAGNOSIS — N182 Chronic kidney disease, stage 2 (mild): Secondary | ICD-10-CM | POA: Diagnosis not present

## 2018-01-31 DIAGNOSIS — I1310 Hypertensive heart and chronic kidney disease without heart failure, with stage 1 through stage 4 chronic kidney disease, or unspecified chronic kidney disease: Secondary | ICD-10-CM | POA: Diagnosis not present

## 2018-02-02 DIAGNOSIS — G2581 Restless legs syndrome: Secondary | ICD-10-CM | POA: Diagnosis not present

## 2018-02-02 DIAGNOSIS — J4540 Moderate persistent asthma, uncomplicated: Secondary | ICD-10-CM | POA: Diagnosis not present

## 2018-03-09 DIAGNOSIS — J4540 Moderate persistent asthma, uncomplicated: Secondary | ICD-10-CM | POA: Diagnosis not present

## 2018-03-09 DIAGNOSIS — G2581 Restless legs syndrome: Secondary | ICD-10-CM | POA: Diagnosis not present

## 2018-05-26 DIAGNOSIS — Z79899 Other long term (current) drug therapy: Secondary | ICD-10-CM | POA: Diagnosis not present

## 2018-05-26 DIAGNOSIS — E782 Mixed hyperlipidemia: Secondary | ICD-10-CM | POA: Diagnosis not present

## 2018-05-26 DIAGNOSIS — I1310 Hypertensive heart and chronic kidney disease without heart failure, with stage 1 through stage 4 chronic kidney disease, or unspecified chronic kidney disease: Secondary | ICD-10-CM | POA: Diagnosis not present

## 2018-05-26 DIAGNOSIS — Z0000 Encounter for general adult medical examination without abnormal findings: Secondary | ICD-10-CM | POA: Diagnosis not present

## 2018-05-26 DIAGNOSIS — K219 Gastro-esophageal reflux disease without esophagitis: Secondary | ICD-10-CM | POA: Diagnosis not present

## 2018-05-26 DIAGNOSIS — N183 Chronic kidney disease, stage 3 (moderate): Secondary | ICD-10-CM | POA: Diagnosis not present

## 2018-05-26 DIAGNOSIS — E039 Hypothyroidism, unspecified: Secondary | ICD-10-CM | POA: Diagnosis not present

## 2018-05-26 DIAGNOSIS — Z1321 Encounter for screening for nutritional disorder: Secondary | ICD-10-CM | POA: Diagnosis not present

## 2018-06-28 DIAGNOSIS — G2581 Restless legs syndrome: Secondary | ICD-10-CM | POA: Diagnosis not present

## 2018-06-28 DIAGNOSIS — J4540 Moderate persistent asthma, uncomplicated: Secondary | ICD-10-CM | POA: Diagnosis not present

## 2018-08-26 DIAGNOSIS — J449 Chronic obstructive pulmonary disease, unspecified: Secondary | ICD-10-CM | POA: Diagnosis not present

## 2018-08-26 DIAGNOSIS — Z0000 Encounter for general adult medical examination without abnormal findings: Secondary | ICD-10-CM | POA: Diagnosis not present

## 2018-08-26 DIAGNOSIS — I1310 Hypertensive heart and chronic kidney disease without heart failure, with stage 1 through stage 4 chronic kidney disease, or unspecified chronic kidney disease: Secondary | ICD-10-CM | POA: Diagnosis not present

## 2018-08-26 DIAGNOSIS — E782 Mixed hyperlipidemia: Secondary | ICD-10-CM | POA: Diagnosis not present

## 2018-08-26 DIAGNOSIS — E559 Vitamin D deficiency, unspecified: Secondary | ICD-10-CM | POA: Diagnosis not present

## 2018-08-26 DIAGNOSIS — E039 Hypothyroidism, unspecified: Secondary | ICD-10-CM | POA: Diagnosis not present

## 2018-08-26 DIAGNOSIS — N183 Chronic kidney disease, stage 3 (moderate): Secondary | ICD-10-CM | POA: Diagnosis not present

## 2018-09-17 DIAGNOSIS — H4311 Vitreous hemorrhage, right eye: Secondary | ICD-10-CM | POA: Diagnosis not present

## 2018-09-17 DIAGNOSIS — H5461 Unqualified visual loss, right eye, normal vision left eye: Secondary | ICD-10-CM | POA: Diagnosis not present

## 2018-09-17 DIAGNOSIS — H33009 Unspecified retinal detachment with retinal break, unspecified eye: Secondary | ICD-10-CM | POA: Diagnosis not present

## 2018-09-17 DIAGNOSIS — E11319 Type 2 diabetes mellitus with unspecified diabetic retinopathy without macular edema: Secondary | ICD-10-CM | POA: Diagnosis not present

## 2018-09-17 DIAGNOSIS — H3561 Retinal hemorrhage, right eye: Secondary | ICD-10-CM | POA: Diagnosis not present

## 2018-09-27 DIAGNOSIS — H4311 Vitreous hemorrhage, right eye: Secondary | ICD-10-CM | POA: Diagnosis not present

## 2018-09-27 DIAGNOSIS — H43811 Vitreous degeneration, right eye: Secondary | ICD-10-CM | POA: Diagnosis not present

## 2018-10-04 DIAGNOSIS — J4540 Moderate persistent asthma, uncomplicated: Secondary | ICD-10-CM | POA: Diagnosis not present

## 2018-10-04 DIAGNOSIS — G2581 Restless legs syndrome: Secondary | ICD-10-CM | POA: Diagnosis not present

## 2018-10-17 DIAGNOSIS — H35373 Puckering of macula, bilateral: Secondary | ICD-10-CM | POA: Diagnosis not present

## 2018-10-17 DIAGNOSIS — H35033 Hypertensive retinopathy, bilateral: Secondary | ICD-10-CM | POA: Diagnosis not present

## 2018-10-17 DIAGNOSIS — H4311 Vitreous hemorrhage, right eye: Secondary | ICD-10-CM | POA: Diagnosis not present

## 2018-10-17 DIAGNOSIS — H43813 Vitreous degeneration, bilateral: Secondary | ICD-10-CM | POA: Diagnosis not present

## 2018-11-15 DIAGNOSIS — H4311 Vitreous hemorrhage, right eye: Secondary | ICD-10-CM | POA: Diagnosis not present

## 2018-11-15 DIAGNOSIS — H35373 Puckering of macula, bilateral: Secondary | ICD-10-CM | POA: Diagnosis not present

## 2018-11-15 DIAGNOSIS — H35362 Drusen (degenerative) of macula, left eye: Secondary | ICD-10-CM | POA: Diagnosis not present

## 2018-11-15 DIAGNOSIS — H3562 Retinal hemorrhage, left eye: Secondary | ICD-10-CM | POA: Diagnosis not present

## 2018-11-28 DIAGNOSIS — R7303 Prediabetes: Secondary | ICD-10-CM | POA: Diagnosis not present

## 2018-11-28 DIAGNOSIS — E6609 Other obesity due to excess calories: Secondary | ICD-10-CM | POA: Diagnosis not present

## 2018-11-28 DIAGNOSIS — I1310 Hypertensive heart and chronic kidney disease without heart failure, with stage 1 through stage 4 chronic kidney disease, or unspecified chronic kidney disease: Secondary | ICD-10-CM | POA: Diagnosis not present

## 2018-11-28 DIAGNOSIS — F419 Anxiety disorder, unspecified: Secondary | ICD-10-CM | POA: Diagnosis not present

## 2018-11-28 DIAGNOSIS — J4540 Moderate persistent asthma, uncomplicated: Secondary | ICD-10-CM | POA: Diagnosis not present

## 2018-11-28 DIAGNOSIS — Z1211 Encounter for screening for malignant neoplasm of colon: Secondary | ICD-10-CM | POA: Diagnosis not present

## 2018-11-28 DIAGNOSIS — E782 Mixed hyperlipidemia: Secondary | ICD-10-CM | POA: Diagnosis not present

## 2018-11-28 DIAGNOSIS — M1A0610 Idiopathic chronic gout, right knee, without tophus (tophi): Secondary | ICD-10-CM | POA: Diagnosis not present

## 2018-11-28 DIAGNOSIS — K219 Gastro-esophageal reflux disease without esophagitis: Secondary | ICD-10-CM | POA: Diagnosis not present

## 2018-11-28 DIAGNOSIS — E039 Hypothyroidism, unspecified: Secondary | ICD-10-CM | POA: Diagnosis not present

## 2018-11-28 DIAGNOSIS — N1831 Chronic kidney disease, stage 3a: Secondary | ICD-10-CM | POA: Diagnosis not present

## 2018-11-28 DIAGNOSIS — E559 Vitamin D deficiency, unspecified: Secondary | ICD-10-CM | POA: Diagnosis not present

## 2018-12-20 DIAGNOSIS — H4311 Vitreous hemorrhage, right eye: Secondary | ICD-10-CM | POA: Diagnosis not present

## 2018-12-20 DIAGNOSIS — H3562 Retinal hemorrhage, left eye: Secondary | ICD-10-CM | POA: Diagnosis not present

## 2018-12-20 DIAGNOSIS — H43813 Vitreous degeneration, bilateral: Secondary | ICD-10-CM | POA: Diagnosis not present

## 2018-12-20 DIAGNOSIS — H35033 Hypertensive retinopathy, bilateral: Secondary | ICD-10-CM | POA: Diagnosis not present

## 2018-12-22 DIAGNOSIS — Z1211 Encounter for screening for malignant neoplasm of colon: Secondary | ICD-10-CM | POA: Diagnosis not present

## 2018-12-22 DIAGNOSIS — R195 Other fecal abnormalities: Secondary | ICD-10-CM | POA: Diagnosis not present

## 2019-01-06 HISTORY — PX: ANKLE FRACTURE SURGERY: SHX122

## 2019-01-12 DIAGNOSIS — D125 Benign neoplasm of sigmoid colon: Secondary | ICD-10-CM | POA: Diagnosis not present

## 2019-01-12 DIAGNOSIS — D120 Benign neoplasm of cecum: Secondary | ICD-10-CM | POA: Diagnosis not present

## 2019-01-12 DIAGNOSIS — K635 Polyp of colon: Secondary | ICD-10-CM | POA: Diagnosis not present

## 2019-01-12 DIAGNOSIS — Z1211 Encounter for screening for malignant neoplasm of colon: Secondary | ICD-10-CM | POA: Diagnosis not present

## 2019-01-12 DIAGNOSIS — K648 Other hemorrhoids: Secondary | ICD-10-CM | POA: Diagnosis not present

## 2019-01-12 DIAGNOSIS — D122 Benign neoplasm of ascending colon: Secondary | ICD-10-CM | POA: Diagnosis not present

## 2019-01-27 DIAGNOSIS — Z1231 Encounter for screening mammogram for malignant neoplasm of breast: Secondary | ICD-10-CM | POA: Diagnosis not present

## 2019-02-28 DIAGNOSIS — E039 Hypothyroidism, unspecified: Secondary | ICD-10-CM | POA: Diagnosis not present

## 2019-02-28 DIAGNOSIS — E782 Mixed hyperlipidemia: Secondary | ICD-10-CM | POA: Diagnosis not present

## 2019-02-28 DIAGNOSIS — N183 Chronic kidney disease, stage 3 unspecified: Secondary | ICD-10-CM | POA: Diagnosis not present

## 2019-02-28 DIAGNOSIS — I1310 Hypertensive heart and chronic kidney disease without heart failure, with stage 1 through stage 4 chronic kidney disease, or unspecified chronic kidney disease: Secondary | ICD-10-CM | POA: Diagnosis not present

## 2019-02-28 DIAGNOSIS — R7303 Prediabetes: Secondary | ICD-10-CM | POA: Diagnosis not present

## 2019-02-28 DIAGNOSIS — Z79899 Other long term (current) drug therapy: Secondary | ICD-10-CM | POA: Diagnosis not present

## 2019-03-21 DIAGNOSIS — H4311 Vitreous hemorrhage, right eye: Secondary | ICD-10-CM | POA: Diagnosis not present

## 2019-03-21 DIAGNOSIS — H43813 Vitreous degeneration, bilateral: Secondary | ICD-10-CM | POA: Diagnosis not present

## 2019-03-21 DIAGNOSIS — H35033 Hypertensive retinopathy, bilateral: Secondary | ICD-10-CM | POA: Diagnosis not present

## 2019-03-21 DIAGNOSIS — H35373 Puckering of macula, bilateral: Secondary | ICD-10-CM | POA: Diagnosis not present

## 2019-04-04 DIAGNOSIS — J4540 Moderate persistent asthma, uncomplicated: Secondary | ICD-10-CM | POA: Diagnosis not present

## 2019-04-04 DIAGNOSIS — G2581 Restless legs syndrome: Secondary | ICD-10-CM | POA: Diagnosis not present

## 2019-06-27 DIAGNOSIS — S82851D Displaced trimalleolar fracture of right lower leg, subsequent encounter for closed fracture with routine healing: Secondary | ICD-10-CM | POA: Diagnosis not present

## 2019-07-04 DIAGNOSIS — R05 Cough: Secondary | ICD-10-CM | POA: Diagnosis not present

## 2019-07-04 DIAGNOSIS — J90 Pleural effusion, not elsewhere classified: Secondary | ICD-10-CM | POA: Diagnosis not present

## 2019-07-04 DIAGNOSIS — J209 Acute bronchitis, unspecified: Secondary | ICD-10-CM | POA: Diagnosis not present

## 2019-08-28 DIAGNOSIS — I1310 Hypertensive heart and chronic kidney disease without heart failure, with stage 1 through stage 4 chronic kidney disease, or unspecified chronic kidney disease: Secondary | ICD-10-CM | POA: Diagnosis not present

## 2019-08-28 DIAGNOSIS — R7309 Other abnormal glucose: Secondary | ICD-10-CM | POA: Diagnosis not present

## 2019-08-28 DIAGNOSIS — N183 Chronic kidney disease, stage 3 unspecified: Secondary | ICD-10-CM | POA: Diagnosis not present

## 2019-08-28 DIAGNOSIS — Z0000 Encounter for general adult medical examination without abnormal findings: Secondary | ICD-10-CM | POA: Diagnosis not present

## 2019-08-28 DIAGNOSIS — E039 Hypothyroidism, unspecified: Secondary | ICD-10-CM | POA: Diagnosis not present

## 2019-08-28 DIAGNOSIS — E785 Hyperlipidemia, unspecified: Secondary | ICD-10-CM | POA: Diagnosis not present

## 2019-09-30 DIAGNOSIS — Z23 Encounter for immunization: Secondary | ICD-10-CM | POA: Diagnosis not present

## 2019-10-03 DIAGNOSIS — G2581 Restless legs syndrome: Secondary | ICD-10-CM | POA: Diagnosis not present

## 2019-10-03 DIAGNOSIS — J4540 Moderate persistent asthma, uncomplicated: Secondary | ICD-10-CM | POA: Diagnosis not present

## 2019-12-04 DIAGNOSIS — R300 Dysuria: Secondary | ICD-10-CM | POA: Diagnosis not present

## 2019-12-04 DIAGNOSIS — Z01419 Encounter for gynecological examination (general) (routine) without abnormal findings: Secondary | ICD-10-CM | POA: Diagnosis not present

## 2019-12-04 DIAGNOSIS — Z1231 Encounter for screening mammogram for malignant neoplasm of breast: Secondary | ICD-10-CM | POA: Diagnosis not present

## 2020-02-24 DIAGNOSIS — Z20822 Contact with and (suspected) exposure to covid-19: Secondary | ICD-10-CM | POA: Diagnosis not present

## 2020-02-24 DIAGNOSIS — U071 COVID-19: Secondary | ICD-10-CM | POA: Diagnosis not present

## 2020-02-27 DIAGNOSIS — J189 Pneumonia, unspecified organism: Secondary | ICD-10-CM | POA: Diagnosis not present

## 2020-02-27 DIAGNOSIS — R0902 Hypoxemia: Secondary | ICD-10-CM | POA: Diagnosis not present

## 2020-02-27 DIAGNOSIS — J1282 Pneumonia due to coronavirus disease 2019: Secondary | ICD-10-CM | POA: Diagnosis not present

## 2020-02-27 DIAGNOSIS — U071 COVID-19: Secondary | ICD-10-CM | POA: Diagnosis not present

## 2020-02-27 DIAGNOSIS — R21 Rash and other nonspecific skin eruption: Secondary | ICD-10-CM | POA: Diagnosis not present

## 2020-02-27 DIAGNOSIS — J439 Emphysema, unspecified: Secondary | ICD-10-CM | POA: Diagnosis not present

## 2020-02-27 DIAGNOSIS — R509 Fever, unspecified: Secondary | ICD-10-CM | POA: Diagnosis not present

## 2020-02-28 DIAGNOSIS — N183 Chronic kidney disease, stage 3 unspecified: Secondary | ICD-10-CM | POA: Diagnosis not present

## 2020-02-28 DIAGNOSIS — Z9989 Dependence on other enabling machines and devices: Secondary | ICD-10-CM | POA: Diagnosis not present

## 2020-02-28 DIAGNOSIS — Z452 Encounter for adjustment and management of vascular access device: Secondary | ICD-10-CM | POA: Diagnosis not present

## 2020-02-28 DIAGNOSIS — E871 Hypo-osmolality and hyponatremia: Secondary | ICD-10-CM | POA: Diagnosis not present

## 2020-02-28 DIAGNOSIS — M47814 Spondylosis without myelopathy or radiculopathy, thoracic region: Secondary | ICD-10-CM | POA: Diagnosis not present

## 2020-02-28 DIAGNOSIS — J439 Emphysema, unspecified: Secondary | ICD-10-CM | POA: Diagnosis not present

## 2020-02-28 DIAGNOSIS — I959 Hypotension, unspecified: Secondary | ICD-10-CM | POA: Diagnosis not present

## 2020-02-28 DIAGNOSIS — R509 Fever, unspecified: Secondary | ICD-10-CM | POA: Diagnosis not present

## 2020-02-28 DIAGNOSIS — E872 Acidosis: Secondary | ICD-10-CM | POA: Diagnosis not present

## 2020-02-28 DIAGNOSIS — J9690 Respiratory failure, unspecified, unspecified whether with hypoxia or hypercapnia: Secondary | ICD-10-CM | POA: Diagnosis not present

## 2020-02-28 DIAGNOSIS — K219 Gastro-esophageal reflux disease without esophagitis: Secondary | ICD-10-CM | POA: Diagnosis not present

## 2020-02-28 DIAGNOSIS — Z880 Allergy status to penicillin: Secondary | ICD-10-CM | POA: Diagnosis not present

## 2020-02-28 DIAGNOSIS — E039 Hypothyroidism, unspecified: Secondary | ICD-10-CM | POA: Diagnosis not present

## 2020-02-28 DIAGNOSIS — J9602 Acute respiratory failure with hypercapnia: Secondary | ICD-10-CM | POA: Diagnosis not present

## 2020-02-28 DIAGNOSIS — R21 Rash and other nonspecific skin eruption: Secondary | ICD-10-CM | POA: Diagnosis not present

## 2020-02-28 DIAGNOSIS — B088 Other specified viral infections characterized by skin and mucous membrane lesions: Secondary | ICD-10-CM | POA: Diagnosis not present

## 2020-02-28 DIAGNOSIS — Z825 Family history of asthma and other chronic lower respiratory diseases: Secondary | ICD-10-CM | POA: Diagnosis not present

## 2020-02-28 DIAGNOSIS — Z791 Long term (current) use of non-steroidal anti-inflammatories (NSAID): Secondary | ICD-10-CM | POA: Diagnosis not present

## 2020-02-28 DIAGNOSIS — Z8249 Family history of ischemic heart disease and other diseases of the circulatory system: Secondary | ICD-10-CM | POA: Diagnosis not present

## 2020-02-28 DIAGNOSIS — J9601 Acute respiratory failure with hypoxia: Secondary | ICD-10-CM | POA: Diagnosis not present

## 2020-02-28 DIAGNOSIS — R578 Other shock: Secondary | ICD-10-CM | POA: Diagnosis not present

## 2020-02-28 DIAGNOSIS — Z79890 Hormone replacement therapy (postmenopausal): Secondary | ICD-10-CM | POA: Diagnosis not present

## 2020-02-28 DIAGNOSIS — J80 Acute respiratory distress syndrome: Secondary | ICD-10-CM | POA: Diagnosis not present

## 2020-02-28 DIAGNOSIS — I129 Hypertensive chronic kidney disease with stage 1 through stage 4 chronic kidney disease, or unspecified chronic kidney disease: Secondary | ICD-10-CM | POA: Diagnosis not present

## 2020-02-28 DIAGNOSIS — R0902 Hypoxemia: Secondary | ICD-10-CM | POA: Diagnosis not present

## 2020-02-28 DIAGNOSIS — Z881 Allergy status to other antibiotic agents status: Secondary | ICD-10-CM | POA: Diagnosis not present

## 2020-02-28 DIAGNOSIS — F419 Anxiety disorder, unspecified: Secondary | ICD-10-CM | POA: Diagnosis not present

## 2020-02-28 DIAGNOSIS — J189 Pneumonia, unspecified organism: Secondary | ICD-10-CM | POA: Diagnosis not present

## 2020-02-28 DIAGNOSIS — R7401 Elevation of levels of liver transaminase levels: Secondary | ICD-10-CM | POA: Diagnosis not present

## 2020-02-28 DIAGNOSIS — J9811 Atelectasis: Secondary | ICD-10-CM | POA: Diagnosis not present

## 2020-02-28 DIAGNOSIS — Z515 Encounter for palliative care: Secondary | ICD-10-CM | POA: Diagnosis not present

## 2020-02-28 DIAGNOSIS — E785 Hyperlipidemia, unspecified: Secondary | ICD-10-CM | POA: Diagnosis not present

## 2020-02-28 DIAGNOSIS — U071 COVID-19: Secondary | ICD-10-CM | POA: Diagnosis present

## 2020-02-28 DIAGNOSIS — Z4682 Encounter for fitting and adjustment of non-vascular catheter: Secondary | ICD-10-CM | POA: Diagnosis not present

## 2020-02-28 DIAGNOSIS — Z79899 Other long term (current) drug therapy: Secondary | ICD-10-CM | POA: Diagnosis not present

## 2020-02-28 DIAGNOSIS — I700 Atherosclerosis of aorta: Secondary | ICD-10-CM | POA: Diagnosis not present

## 2020-02-28 DIAGNOSIS — N170 Acute kidney failure with tubular necrosis: Secondary | ICD-10-CM | POA: Diagnosis not present

## 2020-02-28 DIAGNOSIS — R918 Other nonspecific abnormal finding of lung field: Secondary | ICD-10-CM | POA: Diagnosis not present

## 2020-02-28 DIAGNOSIS — N179 Acute kidney failure, unspecified: Secondary | ICD-10-CM | POA: Diagnosis not present

## 2020-02-28 DIAGNOSIS — I10 Essential (primary) hypertension: Secondary | ICD-10-CM | POA: Diagnosis not present

## 2020-02-28 DIAGNOSIS — Z66 Do not resuscitate: Secondary | ICD-10-CM | POA: Diagnosis not present

## 2020-02-28 DIAGNOSIS — Z87891 Personal history of nicotine dependence: Secondary | ICD-10-CM | POA: Diagnosis not present

## 2020-02-28 DIAGNOSIS — Z888 Allergy status to other drugs, medicaments and biological substances status: Secondary | ICD-10-CM | POA: Diagnosis not present

## 2020-02-28 DIAGNOSIS — J440 Chronic obstructive pulmonary disease with acute lower respiratory infection: Secondary | ICD-10-CM | POA: Diagnosis not present

## 2020-02-28 DIAGNOSIS — J45901 Unspecified asthma with (acute) exacerbation: Secondary | ICD-10-CM | POA: Diagnosis not present

## 2020-02-28 DIAGNOSIS — E875 Hyperkalemia: Secondary | ICD-10-CM | POA: Diagnosis not present

## 2020-02-28 DIAGNOSIS — J1282 Pneumonia due to coronavirus disease 2019: Secondary | ICD-10-CM | POA: Diagnosis not present

## 2020-02-28 DIAGNOSIS — J441 Chronic obstructive pulmonary disease with (acute) exacerbation: Secondary | ICD-10-CM | POA: Diagnosis not present

## 2020-03-07 ENCOUNTER — Inpatient Hospital Stay (HOSPITAL_COMMUNITY): Payer: PPO

## 2020-03-07 ENCOUNTER — Inpatient Hospital Stay (HOSPITAL_COMMUNITY)
Admission: AD | Admit: 2020-03-07 | Discharge: 2020-04-05 | DRG: 208 | Disposition: E | Payer: PPO | Source: Other Acute Inpatient Hospital | Attending: Internal Medicine | Admitting: Internal Medicine

## 2020-03-07 ENCOUNTER — Encounter (HOSPITAL_COMMUNITY): Payer: Self-pay | Admitting: Pulmonary Disease

## 2020-03-07 DIAGNOSIS — N170 Acute kidney failure with tubular necrosis: Secondary | ICD-10-CM | POA: Diagnosis not present

## 2020-03-07 DIAGNOSIS — F419 Anxiety disorder, unspecified: Secondary | ICD-10-CM | POA: Diagnosis not present

## 2020-03-07 DIAGNOSIS — J9600 Acute respiratory failure, unspecified whether with hypoxia or hypercapnia: Secondary | ICD-10-CM

## 2020-03-07 DIAGNOSIS — Z8249 Family history of ischemic heart disease and other diseases of the circulatory system: Secondary | ICD-10-CM | POA: Diagnosis not present

## 2020-03-07 DIAGNOSIS — Z791 Long term (current) use of non-steroidal anti-inflammatories (NSAID): Secondary | ICD-10-CM

## 2020-03-07 DIAGNOSIS — Z515 Encounter for palliative care: Secondary | ICD-10-CM | POA: Diagnosis not present

## 2020-03-07 DIAGNOSIS — R578 Other shock: Secondary | ICD-10-CM | POA: Diagnosis not present

## 2020-03-07 DIAGNOSIS — Z825 Family history of asthma and other chronic lower respiratory diseases: Secondary | ICD-10-CM

## 2020-03-07 DIAGNOSIS — Z880 Allergy status to penicillin: Secondary | ICD-10-CM

## 2020-03-07 DIAGNOSIS — J939 Pneumothorax, unspecified: Secondary | ICD-10-CM

## 2020-03-07 DIAGNOSIS — Z79899 Other long term (current) drug therapy: Secondary | ICD-10-CM

## 2020-03-07 DIAGNOSIS — E785 Hyperlipidemia, unspecified: Secondary | ICD-10-CM | POA: Diagnosis present

## 2020-03-07 DIAGNOSIS — E875 Hyperkalemia: Secondary | ICD-10-CM | POA: Diagnosis present

## 2020-03-07 DIAGNOSIS — J9602 Acute respiratory failure with hypercapnia: Secondary | ICD-10-CM | POA: Diagnosis not present

## 2020-03-07 DIAGNOSIS — Z66 Do not resuscitate: Secondary | ICD-10-CM | POA: Diagnosis not present

## 2020-03-07 DIAGNOSIS — R0902 Hypoxemia: Secondary | ICD-10-CM | POA: Diagnosis not present

## 2020-03-07 DIAGNOSIS — Z978 Presence of other specified devices: Secondary | ICD-10-CM

## 2020-03-07 DIAGNOSIS — I10 Essential (primary) hypertension: Secondary | ICD-10-CM | POA: Diagnosis not present

## 2020-03-07 DIAGNOSIS — J1282 Pneumonia due to coronavirus disease 2019: Secondary | ICD-10-CM | POA: Diagnosis present

## 2020-03-07 DIAGNOSIS — J9601 Acute respiratory failure with hypoxia: Secondary | ICD-10-CM | POA: Diagnosis not present

## 2020-03-07 DIAGNOSIS — K219 Gastro-esophageal reflux disease without esophagitis: Secondary | ICD-10-CM | POA: Diagnosis present

## 2020-03-07 DIAGNOSIS — E872 Acidosis: Secondary | ICD-10-CM | POA: Diagnosis present

## 2020-03-07 DIAGNOSIS — R739 Hyperglycemia, unspecified: Secondary | ICD-10-CM | POA: Diagnosis present

## 2020-03-07 DIAGNOSIS — J439 Emphysema, unspecified: Secondary | ICD-10-CM | POA: Diagnosis not present

## 2020-03-07 DIAGNOSIS — J90 Pleural effusion, not elsewhere classified: Secondary | ICD-10-CM | POA: Diagnosis not present

## 2020-03-07 DIAGNOSIS — R069 Unspecified abnormalities of breathing: Secondary | ICD-10-CM

## 2020-03-07 DIAGNOSIS — Z888 Allergy status to other drugs, medicaments and biological substances status: Secondary | ICD-10-CM | POA: Diagnosis not present

## 2020-03-07 DIAGNOSIS — Z881 Allergy status to other antibiotic agents status: Secondary | ICD-10-CM | POA: Diagnosis not present

## 2020-03-07 DIAGNOSIS — D6489 Other specified anemias: Secondary | ICD-10-CM | POA: Diagnosis present

## 2020-03-07 DIAGNOSIS — Z87891 Personal history of nicotine dependence: Secondary | ICD-10-CM | POA: Diagnosis not present

## 2020-03-07 DIAGNOSIS — N179 Acute kidney failure, unspecified: Secondary | ICD-10-CM

## 2020-03-07 DIAGNOSIS — R579 Shock, unspecified: Secondary | ICD-10-CM

## 2020-03-07 DIAGNOSIS — E871 Hypo-osmolality and hyponatremia: Secondary | ICD-10-CM | POA: Diagnosis present

## 2020-03-07 DIAGNOSIS — J80 Acute respiratory distress syndrome: Secondary | ICD-10-CM | POA: Diagnosis not present

## 2020-03-07 DIAGNOSIS — E861 Hypovolemia: Secondary | ICD-10-CM | POA: Diagnosis present

## 2020-03-07 DIAGNOSIS — E039 Hypothyroidism, unspecified: Secondary | ICD-10-CM | POA: Diagnosis not present

## 2020-03-07 DIAGNOSIS — U071 COVID-19: Principal | ICD-10-CM | POA: Diagnosis present

## 2020-03-07 DIAGNOSIS — Z79890 Hormone replacement therapy (postmenopausal): Secondary | ICD-10-CM | POA: Diagnosis not present

## 2020-03-07 DIAGNOSIS — Z833 Family history of diabetes mellitus: Secondary | ICD-10-CM

## 2020-03-07 DIAGNOSIS — J189 Pneumonia, unspecified organism: Secondary | ICD-10-CM | POA: Diagnosis not present

## 2020-03-07 DIAGNOSIS — T380X5A Adverse effect of glucocorticoids and synthetic analogues, initial encounter: Secondary | ICD-10-CM | POA: Diagnosis present

## 2020-03-07 LAB — COMPREHENSIVE METABOLIC PANEL
ALT: 67 U/L — ABNORMAL HIGH (ref 0–44)
AST: 79 U/L — ABNORMAL HIGH (ref 15–41)
Albumin: 3.2 g/dL — ABNORMAL LOW (ref 3.5–5.0)
Alkaline Phosphatase: 93 U/L (ref 38–126)
Anion gap: 15 (ref 5–15)
BUN: 80 mg/dL — ABNORMAL HIGH (ref 8–23)
CO2: 20 mmol/L — ABNORMAL LOW (ref 22–32)
Calcium: 7.7 mg/dL — ABNORMAL LOW (ref 8.9–10.3)
Chloride: 99 mmol/L (ref 98–111)
Creatinine, Ser: 4.17 mg/dL — ABNORMAL HIGH (ref 0.44–1.00)
GFR, Estimated: 11 mL/min — ABNORMAL LOW (ref >60)
Glucose, Bld: 124 mg/dL — ABNORMAL HIGH (ref 70–99)
Potassium: 5.4 mmol/L — ABNORMAL HIGH (ref 3.5–5.1)
Sodium: 134 mmol/L — ABNORMAL LOW (ref 135–145)
Total Bilirubin: 1.4 mg/dL — ABNORMAL HIGH (ref 0.3–1.2)
Total Protein: 7 g/dL (ref 6.5–8.1)

## 2020-03-07 LAB — CBC WITH DIFFERENTIAL/PLATELET
Abs Immature Granulocytes: 0.21 10*3/uL — ABNORMAL HIGH (ref 0.00–0.07)
Basophils Absolute: 0 10*3/uL (ref 0.0–0.1)
Basophils Relative: 0 %
Eosinophils Absolute: 0 10*3/uL (ref 0.0–0.5)
Eosinophils Relative: 0 %
HCT: 46.1 % — ABNORMAL HIGH (ref 36.0–46.0)
Hemoglobin: 13.5 g/dL (ref 12.0–15.0)
Immature Granulocytes: 1 %
Lymphocytes Relative: 3 %
Lymphs Abs: 0.5 10*3/uL — ABNORMAL LOW (ref 0.7–4.0)
MCH: 29 pg (ref 26.0–34.0)
MCHC: 29.3 g/dL — ABNORMAL LOW (ref 30.0–36.0)
MCV: 98.9 fL (ref 80.0–100.0)
Monocytes Absolute: 0.9 10*3/uL (ref 0.1–1.0)
Monocytes Relative: 6 %
Neutro Abs: 14.5 10*3/uL — ABNORMAL HIGH (ref 1.7–7.7)
Neutrophils Relative %: 90 %
Platelets: 369 10*3/uL (ref 150–400)
RBC: 4.66 MIL/uL (ref 3.87–5.11)
RDW: 14.3 % (ref 11.5–15.5)
WBC: 16.1 10*3/uL — ABNORMAL HIGH (ref 4.0–10.5)
nRBC: 0 % (ref 0.0–0.2)

## 2020-03-07 LAB — PROCALCITONIN: Procalcitonin: 0.75 ng/mL

## 2020-03-07 LAB — MRSA PCR SCREENING: MRSA by PCR: NEGATIVE

## 2020-03-07 LAB — BLOOD GAS, ARTERIAL
Acid-base deficit: 6.4 mmol/L — ABNORMAL HIGH (ref 0.0–2.0)
Bicarbonate: 23.4 mmol/L (ref 20.0–28.0)
FIO2: 100
O2 Saturation: 92.2 %
Patient temperature: 98.6
pCO2 arterial: 68 mmHg (ref 32.0–48.0)
pH, Arterial: 7.163 — CL (ref 7.350–7.450)
pO2, Arterial: 75.1 mmHg — ABNORMAL LOW (ref 83.0–108.0)

## 2020-03-07 LAB — LACTIC ACID, PLASMA: Lactic Acid, Venous: 1.6 mmol/L (ref 0.5–1.9)

## 2020-03-07 LAB — GLUCOSE, CAPILLARY
Glucose-Capillary: 100 mg/dL — ABNORMAL HIGH (ref 70–99)
Glucose-Capillary: 111 mg/dL — ABNORMAL HIGH (ref 70–99)

## 2020-03-07 LAB — HEMOGLOBIN A1C
Hgb A1c MFr Bld: 6.6 % — ABNORMAL HIGH (ref 4.8–5.6)
Mean Plasma Glucose: 142.72 mg/dL

## 2020-03-07 MED ORDER — FENTANYL CITRATE (PF) 100 MCG/2ML IJ SOLN
25.0000 ug | Freq: Once | INTRAMUSCULAR | Status: DC
Start: 2020-03-07 — End: 2020-03-11

## 2020-03-07 MED ORDER — ARTIFICIAL TEARS OPHTHALMIC OINT
1.0000 "application " | TOPICAL_OINTMENT | Freq: Three times a day (TID) | OPHTHALMIC | Status: DC
  Administered 2020-03-08 – 2020-03-11 (×11): 1 via OPHTHALMIC
  Filled 2020-03-07: qty 3.5

## 2020-03-07 MED ORDER — FENTANYL 2500MCG IN NS 250ML (10MCG/ML) PREMIX INFUSION: 25.0000 ug/h | INTRAVENOUS | Status: DC

## 2020-03-07 MED ORDER — VECURONIUM BROMIDE 10 MG IV SOLR: 0.0000 ug/kg/min | Status: DC

## 2020-03-07 MED ORDER — VECURONIUM BOLUS VIA INFUSION: 0.0800 mg/kg | Freq: Once | INTRAVENOUS | Status: DC

## 2020-03-07 MED ORDER — DEXAMETHASONE 1 MG/ML PO CONC
10.0000 mg | Freq: Every day | ORAL | Status: DC
  Administered 2020-03-08: 10 mg
  Filled 2020-03-07: qty 10

## 2020-03-07 MED ORDER — ARTIFICIAL TEARS OPHTHALMIC OINT: 1.0000 "application " | TOPICAL_OINTMENT | Freq: Three times a day (TID) | OPHTHALMIC | Status: DC

## 2020-03-07 MED ORDER — PROPOFOL 1000 MG/100ML IV EMUL
5.0000 ug/kg/min | INTRAVENOUS | Status: DC
  Administered 2020-03-07: 25 ug/kg/min via INTRAVENOUS

## 2020-03-07 MED ORDER — LACTATED RINGERS IV SOLN: INTRAVENOUS | Status: DC

## 2020-03-07 MED ORDER — HEPARIN SODIUM (PORCINE) 5000 UNIT/ML IJ SOLN
5000.0000 [IU] | Freq: Three times a day (TID) | INTRAMUSCULAR | Status: DC
  Administered 2020-03-08 – 2020-03-11 (×10): 5000 [IU] via SUBCUTANEOUS
  Filled 2020-03-07 (×10): qty 1

## 2020-03-07 MED ORDER — ORAL CARE MOUTH RINSE
15.0000 mL | OROMUCOSAL | Status: DC
  Administered 2020-03-08 – 2020-03-11 (×34): 15 mL via OROMUCOSAL

## 2020-03-07 MED ORDER — MIDAZOLAM BOLUS VIA INFUSION: 1.0000 mg | INTRAVENOUS | Status: DC | PRN

## 2020-03-07 MED ORDER — SODIUM CHLORIDE 0.9 % IV SOLN
0.0000 ug/kg/min | INTRAVENOUS | Status: DC
  Administered 2020-03-07: 3 ug/kg/min via INTRAVENOUS
  Administered 2020-03-08: 2 ug/kg/min via INTRAVENOUS
  Administered 2020-03-09 – 2020-03-10 (×2): 1.5 ug/kg/min via INTRAVENOUS
  Filled 2020-03-07 (×6): qty 20

## 2020-03-07 MED ORDER — LEVOFLOXACIN IN D5W 500 MG/100ML IV SOLN: 500.0000 mg | INTRAVENOUS | Status: DC

## 2020-03-07 MED ORDER — FENTANYL 2500MCG IN NS 250ML (10MCG/ML) PREMIX INFUSION
25.0000 ug/h | INTRAVENOUS | Status: DC
  Administered 2020-03-07: 200 ug/h via INTRAVENOUS

## 2020-03-07 MED ORDER — CHLORHEXIDINE GLUCONATE 0.12% ORAL RINSE (MEDLINE KIT)
15.0000 mL | Freq: Two times a day (BID) | OROMUCOSAL | Status: DC
  Administered 2020-03-07 – 2020-03-11 (×7): 15 mL via OROMUCOSAL

## 2020-03-07 MED ORDER — FENTANYL BOLUS VIA INFUSION
25.0000 ug | INTRAVENOUS | Status: DC | PRN
  Filled 2020-03-07: qty 25

## 2020-03-07 MED ORDER — MIDAZOLAM 50MG/50ML (1MG/ML) PREMIX INFUSION: 2.0000 mg/h | INTRAVENOUS | Status: DC

## 2020-03-07 MED ORDER — PROPOFOL 1000 MG/100ML IV EMUL: 25.0000 ug/kg/min | INTRAVENOUS | Status: DC

## 2020-03-07 MED ORDER — FENTANYL 2500MCG IN NS 250ML (10MCG/ML) PREMIX INFUSION
25.0000 ug/h | INTRAVENOUS | Status: DC
  Administered 2020-03-07 – 2020-03-08 (×2): 300 ug/h via INTRAVENOUS
  Filled 2020-03-07: qty 250

## 2020-03-07 MED ORDER — FENTANYL CITRATE (PF) 100 MCG/2ML IJ SOLN: 25.0000 ug | Freq: Once | INTRAMUSCULAR | Status: DC

## 2020-03-07 MED ORDER — FENTANYL BOLUS VIA INFUSION: 25.0000 ug | INTRAVENOUS | Status: DC | PRN

## 2020-03-07 MED ORDER — PROPOFOL 1000 MG/100ML IV EMUL
5.0000 ug/kg/min | INTRAVENOUS | Status: DC
  Administered 2020-03-07: 25 ug/kg/min via INTRAVENOUS
  Administered 2020-03-08: 5 ug/kg/min via INTRAVENOUS
  Administered 2020-03-08: 25 ug/kg/min via INTRAVENOUS
  Administered 2020-03-09 – 2020-03-10 (×3): 5 ug/kg/min via INTRAVENOUS
  Filled 2020-03-07 (×7): qty 100

## 2020-03-07 MED ORDER — SODIUM CHLORIDE 0.9 % IV SOLN
2.0000 g | Freq: Every day | INTRAVENOUS | Status: DC
  Administered 2020-03-07 – 2020-03-10 (×4): 2 g via INTRAVENOUS
  Filled 2020-03-07 (×4): qty 2

## 2020-03-07 MED ORDER — CHLORHEXIDINE GLUCONATE CLOTH 2 % EX PADS
6.0000 | MEDICATED_PAD | Freq: Every day | CUTANEOUS | Status: DC
  Administered 2020-03-07 – 2020-03-10 (×4): 6 via TOPICAL

## 2020-03-07 MED ORDER — CISATRACURIUM BOLUS VIA INFUSION
0.1000 mg/kg | Freq: Once | INTRAVENOUS | Status: AC
  Administered 2020-03-08: 8.2 mg via INTRAVENOUS
  Filled 2020-03-07: qty 9

## 2020-03-07 NOTE — Progress Notes (Addendum)
Pharmacy Antibiotic Note  Janet Dean is a 75 y.o. female transferred from [facility] on 03/30/2020 with bilateral pneumonia, ARDS due to [place]. Pharmacy has been consulted for Cefepime dosing. Patient has penicillin allergy (anaphylaxis), but received Ceftriaxone 02/27/20-03/04/20 at [facility] without complication, so Dr. Oletta Darter and Dr. Milon Dikes okay to proceed with Cefepime. Patient was initially ordered Levofloxacin, but due to allergy to Moxifloxacin, was changed to Cefepime. Confirmed that patient received multiple doses of Ceftriaxone at [facility] with pharmacist, Kenney Houseman.   Plan: Cefepime 2g IV q24h Monitor renal function, cultures, clinical course  Height: 5\' 2"  (157.5 cm) Weight: 81.7 kg (180 lb 1 oz) IBW/kg (Calculated) : 50.1  No data recorded.  Recent Labs  Lab 03/10/2020 2104  WBC 16.1*  CREATININE 4.17*  LATICACIDVEN 1.6    Estimated Creatinine Clearance: 12.1 mL/min (A) (by C-G formula based on SCr of 4.17 mg/dL (H)).    Allergies  Allergen Reactions  . Other     Senior dose of flu vaccine caused rash and hives  . Penicillins Anaphylaxis  . Pantoprazole Sodium Other (See Comments)    UNKNOWN  . Prednisone Swelling    Tongue swelling  . Avelox [Moxifloxacin Hcl In Nacl] Swelling and Rash  . Fluoxetine Rash and Other (See Comments)    Wheezing and Rash  . Kenalog [Triamcinolone Acetonide] Swelling and Rash  . Lipitor [Atorvastatin] Rash    Antimicrobials this admission: 3/3 Cefepime >>  Dose adjustments this admission: --  Microbiology results: 3/3 Tracheal Aspirate: 3/3 MRSA PCR: negative   Thank you for allowing pharmacy to be a part of this patient's care.  Luiz Ochoa 03/20/2020 10:17 PM

## 2020-03-07 NOTE — H&P (Signed)
NAME:  Janet Dean, MRN:  [id number], DOB:  [birth year], LOS: 0 ADMISSION DATE:  04/02/2020, CONSULTATION DATE: March 07, 2020 REFERRING MD: Janet Dean, CHIEF COMPLAINT: ARDS COVID-19 infection  Brief History:  Patient with COVID-19 infection was +2 weeks ago went to Janet Dean was transferred here for family request  History of Present Illness:  Patient with 75 years old COVID-62 infection bilateral pneumonia intubated to 3 days ago here with family request for higher level of care already asked had a CAT scan at the other Dean with no PE and fibrosis bullae and emphysema she received remdesivir and Actemra she has multiple allergies.  Past Medical History:  COPD emphysema hypertension hyperlipidemia   Objective   There were no vitals taken for this visit.    Vent Mode: PRVC FiO2 (%):  [100 %] 100 % Set Rate:  [24 bmp] 24 bmp Vt Set:  [300 mL] 300 mL Plateau Pressure:  [28 cmH20] 28 cmH20  No intake or output data in the 24 hours ending 04/04/2020 2011 There were no vitals filed for this visit.  Examination: General: Intubated sedated dyssynchronous with the vent Neuro: Intubated sedated chest  HEENT:  atraumatic , no jaundice , dry mucous membranes  Cardiovascular:  Irregular irregular , ESM 2/6 in the aortic area  Lungs: Bilateral inspiratory crackles Abdomen:  Soft lax +BS , no tenderness . Musculoskeletal:  WNL , normal pulses  Skin:  No rash      Assessment & Plan:  COVID-19 infection bilateral pneumonia ARDS We will sedated and paralyzed the patient she is hypercapnic now so we will hold off any proning. Start dexamethasone possible Janet Dean  Acute renal failure acute kidney injury. Most likely due to hypoxia and Covid but avoid nephrotoxic start slow hydration.  CAT scan with no PE and emphysema fibrosis was done as an outpatient.   History of hyponatremia could be dilutional we will wait for the labs.    Patient has multiple allergies we  will start her on levofloxacin.   For DVT prophylaxis we will do heparin given her creatinine last checked 3.3.   Patient is a full code prognosis is grim call us with question thank you for this consultation.   Labs   CBC: No results for input(s): WBC, NEUTROABS, HGB, HCT, MCV, PLT in the last 168 hours.  Basic Metabolic Panel: No results for input(s): NA, K, CL, CO2, GLUCOSE, BUN, CREATININE, CALCIUM, MG, PHOS in the last 168 hours. GFR: CrCl cannot be calculated (No successful lab value found.). No results for input(s): PROCALCITON, WBC, LATICACIDVEN in the last 168 hours.  Liver Function Tests: No results for input(s): AST, ALT, ALKPHOS, BILITOT, PROT, ALBUMIN in the last 168 hours. No results for input(s): LIPASE, AMYLASE in the last 168 hours. No results for input(s): AMMONIA in the last 168 hours.  ABG    Component Value Date/Time   PHART 7.163 (LL) 03/30/2020 1935   PCO2ART 68.0 (HH) 03/06/2020 1935   PO2ART 75.1 (L) 03/26/2020 1935   HCO3 23.4 03/06/2020 1935   ACIDBASEDEF 6.4 (H) 03/31/2020 1935   O2SAT 92.2 04/01/2020 1935     Coagulation Profile: No results for input(s): INR, PROTIME in the last 168 hours.  Cardiac Enzymes: No results for input(s): CKTOTAL, CKMB, CKMBINDEX, TROPONINI in the last 168 hours.  HbA1C: No results found for: HGBA1C  CBG: No results for input(s): GLUCAP in the last 168 hours.  Review of Systems:   Unable to obtain due to patient condition  Past  Medical History:  She,  has a past medical history of Anxiety, Arthritis, Asthma, GERD (gastroesophageal reflux disease), Gout, High blood pressure, Hypothyroidism, Kidney disease, and Tobacco abuse.   Surgical History:   Past Surgical History:  Procedure Laterality Date  . OTHER SURGICAL HISTORY  1988   Tubal Ligation Reversal  . SINOSCOPY  1992  . TUBAL LIGATION       Social History:   reports that she has been smoking cigarettes. She has never used smokeless tobacco. She  reports that she does not drink alcohol and does not use drugs.   Family History:  Her family history includes Asthma in her maternal grandmother; Diabetes in her mother; Emphysema in her father; Heart disease in her mother; Transient ischemic attack in her mother.   Allergies Allergies  Allergen Reactions  . Penicillins Anaphylaxis  . Pantoprazole Sodium Other (See Comments)    UNKNOWN  . Prednisone Swelling    Tongue swelling  . Avelox [Moxifloxacin Hcl In Nacl] Swelling and Rash  . Fluoxetine Rash and Other (See Comments)    Wheezing and Rash  . Kenalog [Triamcinolone Acetonide] Swelling and Rash  . Lipitor [Atorvastatin] Rash     Home Medications  Prior to Admission medications   Medication Sig Start Date End Date Taking? Authorizing Provider  Calcium Carbonate (CALCIUM 600 PO) Take 600 mg by mouth daily.    [provider]  celecoxib (CELEBREX) 200 MG capsule Take 200 mg by mouth daily. 01/31/15   [provider]  cetirizine (ZYRTEC) 10 MG tablet Take 10 mg by mouth daily as needed for allergies.    [provider]  colchicine 0.6 MG tablet Take 0.6 mg by mouth daily.    [provider]  COMBIVENT RESPIMAT 20-100 MCG/ACT AERS respimat INHALE ONE PUFF FOUR TIMES DAILY (MAX OF SIX PUFFS IN 24 hours) 01/04/15   [provider]  dexlansoprazole (DEXILANT) 60 MG capsule Take one capsule once every morning as directed for reflux. 04/10/15   Kozlow, Donnamarie Poag, MD  Fluticasone Furoate (ARNUITY ELLIPTA) 100 MCG/ACT AEPB Inhale 1 Dose into the lungs daily. Rinse, gargle, and spit after use. 04/10/15   Kozlow, Donnamarie Poag, MD  GuaiFENesin (MUCINEX PO) Take 400 mg by mouth as needed.    [provider]  IMIPRAMINE HCL PO Take 200 mg by mouth daily.    [provider]  levothyroxine (SYNTHROID, LEVOTHROID) 75 MCG tablet Take 75 mcg by mouth daily.    [provider]  losartan (COZAAR) 50 MG tablet Take 50 mg by mouth daily.     [provider]  Multiple Vitamins-Minerals (CENTRUM SILVER PO) Take 1 tablet by mouth daily.    [provider]  ranitidine (ZANTAC) 150 MG tablet Take 150 mg by mouth as needed for heartburn.    [provider]  simvastatin (ZOCOR) 10 MG tablet Take 10 mg by mouth daily.    [provider]  Vitamin D, Ergocalciferol, (DRISDOL) 50000 units CAPS capsule Take 50,000 Units by mouth once a week.    [provider]     Critical care time: Critical care time spent the care of this patient is 38 minutes.

## 2020-03-07 NOTE — Progress Notes (Signed)
eLink Physician-Brief Progress Note Patient Name: Janet Dean DOB: 1945-03-17 MRN: [id number]   Date of Service  04/02/2020  HPI/Events of Note  Nursing and pharmacy confusion about NMB orders. NMB not ordered.   eICU Interventions  Will reorder PCCM NMB protocol to provide clarity.     Intervention Category Major Interventions: Other:  Lysle Dingwall 03/19/2020, 10:03 PM

## 2020-03-08 ENCOUNTER — Inpatient Hospital Stay (HOSPITAL_COMMUNITY): Payer: PPO

## 2020-03-08 DIAGNOSIS — U071 COVID-19: Secondary | ICD-10-CM | POA: Diagnosis not present

## 2020-03-08 DIAGNOSIS — N179 Acute kidney failure, unspecified: Secondary | ICD-10-CM | POA: Diagnosis not present

## 2020-03-08 DIAGNOSIS — J80 Acute respiratory distress syndrome: Secondary | ICD-10-CM | POA: Diagnosis not present

## 2020-03-08 DIAGNOSIS — J1282 Pneumonia due to coronavirus disease 2019: Secondary | ICD-10-CM | POA: Diagnosis not present

## 2020-03-08 LAB — PROCALCITONIN: Procalcitonin: 2.84 ng/mL

## 2020-03-08 LAB — BLOOD GAS, ARTERIAL
Acid-Base Excess: 1.3 mmol/L (ref 0.0–2.0)
Acid-base deficit: 10.5 mmol/L — ABNORMAL HIGH (ref 0.0–2.0)
Acid-base deficit: 1.3 mmol/L (ref 0.0–2.0)
Acid-base deficit: 0.4 mmol/L (ref 0.0–2.0)
Bicarbonate: 22.2 mmol/L (ref 20.0–28.0)
Bicarbonate: 28.7 mmol/L — ABNORMAL HIGH (ref 20.0–28.0)
Bicarbonate: 30.3 mmol/L — ABNORMAL HIGH (ref 20.0–28.0)
Bicarbonate: 30.9 mmol/L — ABNORMAL HIGH (ref 20.0–28.0)
Drawn by: 29503
FIO2: 100
FIO2: 100
FIO2: 100
FIO2: 100
MECHVT: 300 mL
MECHVT: 300 mL
MECHVT: 300 mL
O2 Saturation: 89.1 %
O2 Saturation: 86.5 %
O2 Saturation: 89.3 %
O2 Saturation: 94.4 %
PEEP: 16 cmH2O
PEEP: 16 cmH2O
Patient temperature: 36.3
Patient temperature: 98.6
Patient temperature: 98.6
Patient temperature: 98.6
RATE: 24 resp/min
RATE: 35 resp/min
RATE: 35 resp/min
Sample type: 23532
pCO2 arterial: 85.9 mmHg (ref 32.0–48.0)
pCO2 arterial: 79.1 mmHg (ref 32.0–48.0)
pCO2 arterial: 83.5 mmHg (ref 32.0–48.0)
pCO2 arterial: 78.4 mmHg (ref 32.0–48.0)
pH, Arterial: 7.042 — CL (ref 7.350–7.450)
pH, Arterial: 7.185 — CL (ref 7.350–7.450)
pH, Arterial: 7.186 — CL (ref 7.350–7.450)
pH, Arterial: 7.22 — ABNORMAL LOW (ref 7.350–7.450)
pO2, Arterial: 67.1 mmHg — ABNORMAL LOW (ref 83.0–108.0)
pO2, Arterial: 53.2 mmHg — ABNORMAL LOW (ref 83.0–108.0)
pO2, Arterial: 61.4 mmHg — ABNORMAL LOW (ref 83.0–108.0)
pO2, Arterial: 80 mmHg — ABNORMAL LOW (ref 83.0–108.0)

## 2020-03-08 LAB — CBC WITH DIFFERENTIAL/PLATELET
Abs Immature Granulocytes: 0.32 10*3/uL — ABNORMAL HIGH (ref 0.00–0.07)
Basophils Absolute: 0.1 10*3/uL (ref 0.0–0.1)
Basophils Relative: 0 %
Eosinophils Absolute: 0 10*3/uL (ref 0.0–0.5)
Eosinophils Relative: 0 %
HCT: 43.5 % (ref 36.0–46.0)
Hemoglobin: 12.9 g/dL (ref 12.0–15.0)
Immature Granulocytes: 2 %
Lymphocytes Relative: 2 %
Lymphs Abs: 0.5 10*3/uL — ABNORMAL LOW (ref 0.7–4.0)
MCH: 29.2 pg (ref 26.0–34.0)
MCHC: 29.7 g/dL — ABNORMAL LOW (ref 30.0–36.0)
MCV: 98.4 fL (ref 80.0–100.0)
Monocytes Absolute: 0.8 10*3/uL (ref 0.1–1.0)
Monocytes Relative: 4 %
Neutro Abs: 20.2 10*3/uL — ABNORMAL HIGH (ref 1.7–7.7)
Neutrophils Relative %: 92 %
Platelets: 469 10*3/uL — ABNORMAL HIGH (ref 150–400)
RBC: 4.42 MIL/uL (ref 3.87–5.11)
RDW: 14.2 % (ref 11.5–15.5)
WBC: 21.9 10*3/uL — ABNORMAL HIGH (ref 4.0–10.5)
nRBC: 0 % (ref 0.0–0.2)

## 2020-03-08 LAB — COMPREHENSIVE METABOLIC PANEL
ALT: 60 U/L — ABNORMAL HIGH (ref 0–44)
AST: 73 U/L — ABNORMAL HIGH (ref 15–41)
Albumin: 2.6 g/dL — ABNORMAL LOW (ref 3.5–5.0)
Alkaline Phosphatase: 82 U/L (ref 38–126)
Anion gap: 13 (ref 5–15)
BUN: 85 mg/dL — ABNORMAL HIGH (ref 8–23)
CO2: 23 mmol/L (ref 22–32)
Calcium: 7.7 mg/dL — ABNORMAL LOW (ref 8.9–10.3)
Chloride: 99 mmol/L (ref 98–111)
Creatinine, Ser: 4.5 mg/dL — ABNORMAL HIGH (ref 0.44–1.00)
GFR, Estimated: 10 mL/min — ABNORMAL LOW (ref >60)
Glucose, Bld: 138 mg/dL — ABNORMAL HIGH (ref 70–99)
Potassium: 5.3 mmol/L — ABNORMAL HIGH (ref 3.5–5.1)
Sodium: 135 mmol/L (ref 135–145)
Total Bilirubin: 1.5 mg/dL — ABNORMAL HIGH (ref 0.3–1.2)
Total Protein: 6.3 g/dL — ABNORMAL LOW (ref 6.5–8.1)

## 2020-03-08 LAB — BASIC METABOLIC PANEL
Anion gap: 16 — ABNORMAL HIGH (ref 5–15)
BUN: 86 mg/dL — ABNORMAL HIGH (ref 8–23)
CO2: 28 mmol/L (ref 22–32)
Calcium: 7.4 mg/dL — ABNORMAL LOW (ref 8.9–10.3)
Chloride: 92 mmol/L — ABNORMAL LOW (ref 98–111)
Creatinine, Ser: 4.37 mg/dL — ABNORMAL HIGH (ref 0.44–1.00)
GFR, Estimated: 10 mL/min — ABNORMAL LOW (ref >60)
Glucose, Bld: 235 mg/dL — ABNORMAL HIGH (ref 70–99)
Potassium: 4.8 mmol/L (ref 3.5–5.1)
Sodium: 136 mmol/L (ref 135–145)

## 2020-03-08 LAB — GLUCOSE, CAPILLARY
Glucose-Capillary: 115 mg/dL — ABNORMAL HIGH (ref 70–99)
Glucose-Capillary: 173 mg/dL — ABNORMAL HIGH (ref 70–99)
Glucose-Capillary: 191 mg/dL — ABNORMAL HIGH (ref 70–99)
Glucose-Capillary: 166 mg/dL — ABNORMAL HIGH (ref 70–99)
Glucose-Capillary: 146 mg/dL — ABNORMAL HIGH (ref 70–99)
Glucose-Capillary: 145 mg/dL — ABNORMAL HIGH (ref 70–99)

## 2020-03-08 LAB — TRIGLYCERIDES: Triglycerides: 311 mg/dL — ABNORMAL HIGH (ref <150)

## 2020-03-08 LAB — MAGNESIUM: Magnesium: 3 mg/dL — ABNORMAL HIGH (ref 1.7–2.4)

## 2020-03-08 MED ORDER — INSULIN ASPART 100 UNIT/ML ~~LOC~~ SOLN
0.0000 [IU] | SUBCUTANEOUS | Status: DC
  Administered 2020-03-08 (×2): 2 [IU] via SUBCUTANEOUS
  Administered 2020-03-08 – 2020-03-10 (×3): 3 [IU] via SUBCUTANEOUS
  Administered 2020-03-10: 5 [IU] via SUBCUTANEOUS
  Administered 2020-03-10 – 2020-03-11 (×6): 3 [IU] via SUBCUTANEOUS

## 2020-03-08 MED ORDER — SODIUM ZIRCONIUM CYCLOSILICATE 10 G PO PACK
10.0000 g | PACK | Freq: Two times a day (BID) | ORAL | Status: AC
  Administered 2020-03-08 (×2): 10 g
  Filled 2020-03-08 (×2): qty 1

## 2020-03-08 MED ORDER — STERILE WATER FOR INJECTION IV SOLN
INTRAVENOUS | Status: DC
  Filled 2020-03-08: qty 150
  Filled 2020-03-08: qty 850
  Filled 2020-03-08: qty 150
  Filled 2020-03-08 (×2): qty 850
  Filled 2020-03-08 (×2): qty 150
  Filled 2020-03-08: qty 850
  Filled 2020-03-08 (×4): qty 150
  Filled 2020-03-08: qty 850
  Filled 2020-03-08: qty 150
  Filled 2020-03-08: qty 850
  Filled 2020-03-08: qty 150

## 2020-03-08 MED ORDER — VECURONIUM BROMIDE 10 MG IV SOLR
INTRAVENOUS | Status: AC
  Administered 2020-03-08: 10 mg via INTRAVENOUS
  Filled 2020-03-08: qty 10

## 2020-03-08 MED ORDER — FAMOTIDINE 40 MG/5ML PO SUSR
20.0000 mg | Freq: Every day | ORAL | Status: DC
  Administered 2020-03-08 – 2020-03-11 (×4): 20 mg
  Filled 2020-03-08 (×4): qty 2.5

## 2020-03-08 MED ORDER — FENTANYL CITRATE (PF) 2500 MCG/50ML IJ SOLN
25.0000 ug/h | Status: DC
  Administered 2020-03-08: 25 ug/h via INTRAVENOUS
  Filled 2020-03-08: qty 100

## 2020-03-08 MED ORDER — SODIUM ZIRCONIUM CYCLOSILICATE 10 G PO PACK
10.0000 g | PACK | Freq: Once | ORAL | Status: AC
  Administered 2020-03-08: 10 g
  Filled 2020-03-08: qty 1

## 2020-03-08 MED ORDER — NOREPINEPHRINE 4 MG/250ML-% IV SOLN
0.0000 ug/min | INTRAVENOUS | Status: DC
  Administered 2020-03-08 – 2020-03-11 (×2): 2 ug/min via INTRAVENOUS
  Filled 2020-03-08 (×2): qty 250

## 2020-03-08 MED ORDER — SODIUM BICARBONATE 8.4 % IV SOLN
200.0000 meq | Freq: Once | INTRAVENOUS | Status: AC
  Administered 2020-03-08: 200 meq via INTRAVENOUS
  Filled 2020-03-08: qty 50

## 2020-03-08 MED ORDER — NOREPINEPHRINE 4 MG/250ML-% IV SOLN
INTRAVENOUS | Status: AC
  Filled 2020-03-08: qty 250

## 2020-03-08 MED ORDER — VECURONIUM BROMIDE 10 MG IV SOLR: 10.0000 mg | Freq: Once | INTRAVENOUS | Status: AC

## 2020-03-08 NOTE — Progress Notes (Signed)
eLink Physician-Brief Progress Note Patient Name: Janet Dean DOB: August 20, 1945 MRN: [id number]   Date of Service  03/08/2020  HPI/Events of Note  Hypotension - BP = 68/48.  eICU Interventions  Plan: 1. Norepinephrine IV infusion. Titrate to MAP >= 65. 2. Monitor CVP now and Q 4 hours.  3. Await ABG.     Intervention Category Major Interventions: Hypotension - evaluation and management  Dimitrious Micciche Eugene 03/08/2020, 4:20 AM

## 2020-03-08 NOTE — Progress Notes (Signed)
Initial Nutrition Assessment  DOCUMENTATION CODES:   Obesity unspecified  INTERVENTION:  - if TF warranted, recommend Pivot 1.5 @ 30 ml/hr to advance by 10 ml every 8 hours to reach goal rate of 50 ml/hr. - at goal rate, this regimen + kcal from current propofol rate will provide 1862 kcal, 112 grams protein, and 911 ml free water.  NUTRITION DIAGNOSIS:   Increased nutrient needs related to acute illness,catabolic illness (AQTMA-26 infection) as evidenced by estimated needs.  GOAL:   Provide needs based on ASPEN/SCCM guidelines  MONITOR:   Vent status,Labs,Weight trends  REASON FOR ASSESSMENT:   Ventilator  ASSESSMENT:   75 years old female with medical history of asthma, gout, hypothyroidism, anxiety, HTN, arthritis, GERD, kidney disease, and tobacco abuse. She was admitted from [facility] with COVID-19 infection, bilateral PNA, ARDS. She was intubated at [facility]. Family requested higher level of care.  Patient discussed in rounds this AM.  Patient remains intubated with OGT in place, OGT clamped.   Weight yesterday was 181 lb and the most recently documented weight PTA was at [facility] on 12/04/19 when she weighed 182 lb. The only other weight available in the chart was from 04/10/15.  Per notes: - ARDS 2/2 COVID-19 infection - ARF/AKI - Full Code at this time   Patient is currently intubated on ventilator support MV: 9.5 L/min Temp (24hrs), Avg:97 F (36.1 C), Min:97 F (36.1 C), Max:97 F (36.1 C) Propofol: 2.37 ml/hr (62 kcal) BP: 152/41 and MAP: 79  Labs reviewed; CBGs: 115, 173, 191 mg/dl, K: 5.3 mmol/l, BUN: 85 mg/dl, creatinine: 4.5 mg/dl, Ca: 7.7 mg/dl, LFT elevated but trending down from 3/3, GFR: 10 ml/min. Medications reviewed; 10 g lokelma x3 doses 3/4.  IVF; 150 mEq sodium bicarb-sterile water @ 125 ml/hr. Drips; nimbex @ 3 mcg/kg/min, fentanyl @ 75 mcg/hr, levo @ 4 mcg/min, propofol @ 5 mcg/kg/min    NUTRITION - FOCUSED PHYSICAL EXAM:  did  not complete at this time pending [place] decisions.  Diet Order:   Diet Order    None      EDUCATION NEEDS:   Not appropriate for education at this time  Skin:  Skin Assessment: Skin Integrity Issues: Skin Integrity Issues:: Other (Comment) Other: MASD and skin tear to buttocks  Last BM:  PTA/unknown  Height:   Ht Readings from Last 1 Encounters:  03/12/2020 5\' 2"  (1.575 m)    Weight:   Wt Readings from Last 1 Encounters:  03/14/2020 81.7 kg    Estimated Nutritional Needs:  Kcal:  1700-1890 kcal Protein:  110-125 grams Fluid:  >/= 2.2 L/day      Jarome Matin, MS, RD, LDN, CNSC Inpatient Clinical Dietitian RD pager # available in [place]  After hours/weekend pager # available in [facility]

## 2020-03-08 NOTE — TOC Initial Note (Signed)
Transition of Care [facility]) - Initial/Assessment Note    Patient Details  Name: Janet Dean MRN: [id number] Date of Birth: June 08, 1945  [facility] CM/SW Contact:    Leeroy Cha, RN Phone Number: 03/08/2020, 10:15 AM  Clinical Narrative:                 Assessment & Plan:  COVID-19 infection bilateral pneumonia ARDS We will sedated and paralyzed the patient she is hypercapnic now so we will hold off any proning. Start dexamethasone possible Barcitinib  Acute renal failure acute kidney injury. Most likely due to hypoxia and Covid but avoid nephrotoxic start slow hydration.  CAT scan with no PE and emphysema fibrosis was done as an outpatient.   History of hyponatremia could be dilutional we will wait for the labs.    Patient has multiple allergies we will start her on levofloxacin.   For DVT prophylaxis we will do heparin given her creatinine last checked 3.3.   Patient is a full code prognosis is grim call us with question thank you for this consultation intubated on admission PLAN: unable to determine at this time due to condition and vent  Expected Discharge Plan: Home/Self Care Barriers to Discharge: Continued Medical Work up   Patient Goals and CMS Choice Patient states their goals for this hospitalization and ongoing recovery are:: unable to state      Expected Discharge Plan and Services Expected Discharge Plan: Home/Self Care   Discharge Planning Services: CM Consult   Living arrangements for the past 2 months: Single Family Home                                      Prior Living Arrangements/Services Living arrangements for the past 2 months: Single Family Home Lives with:: Spouse Patient language and need for interpreter reviewed:: Yes        Need for Family Participation in Patient Care: Yes (Comment) Care giver support system in place?: Yes (comment)   Criminal Activity/Legal Involvement Pertinent to Current  Situation/Hospitalization: No - Comment as needed  Activities of Daily Living Home Assistive Devices/Equipment: Other (Comment) (pads for urine, magnifying glass, reading glasses) ADL Screening (condition at time of admission) Patient's cognitive ability adequate to safely complete daily activities?: No Is the patient deaf or have difficulty hearing?: No Does the patient have difficulty seeing, even when wearing glasses/contacts?: No (viscous fluid atrophied behind eye - retina did not detach but had treatment for it -floaters, hard to drive night- not sure eye was affected) Does the patient have difficulty concentrating, remembering, or making decisions?: No Patient able to express need for assistance with ADLs?: No Does the patient have difficulty dressing or bathing?: Yes Independently performs ADLs?: No Communication: Dependent Is this a change from baseline?: Change from baseline, expected to last >3 days Dressing (OT): Dependent Is this a change from baseline?: Change from baseline, expected to last >3 days Grooming: Dependent Is this a change from baseline?: Change from baseline, expected to last >3 days Feeding: Dependent Is this a change from baseline?: Change from baseline, expected to last >3 days Bathing: Dependent Is this a change from baseline?: Change from baseline, expected to last >3 days Toileting: Dependent Is this a change from baseline?: Change from baseline, expected to last >3days In/Out Bed: Dependent Is this a change from baseline?: Change from baseline, expected to last >3 days Walks in Home: Dependent Is  this a change from baseline?: Change from baseline, expected to last >3 days Does the patient have difficulty walking or climbing stairs?: Yes Weakness of Legs: Both Weakness of Arms/Hands: Both  Permission Sought/Granted                  Emotional Assessment Appearance:: Appears stated age Attitude/Demeanor/Rapport: Unable to Assess Affect  (typically observed): Unable to Assess Orientation: : Oriented to Self,Oriented to Place,Oriented to  Time,Oriented to Situation Alcohol / Substance Use: Not Applicable Psych Involvement: No (comment)  Admission diagnosis:  Acute respiratory distress syndrome (ARDS) due to COVID-19 virus ([place]) [U07.1, J80] Patient Active Problem List   Diagnosis Date Noted  . Acute respiratory failure ([place])   . AKI (acute kidney injury) ([place])   . Pneumonia due to COVID-19 virus   . Acute respiratory distress syndrome (ARDS) due to COVID-19 virus [facility])    PCP:  Sherlyn Lees, [place] Pharmacy:   [facility], [place] - [address] [place] [address] Phone: [phone] Fax: [phone]     Social Determinants of Health (SDOH) Interventions    Readmission Risk Interventions No flowsheet data found.

## 2020-03-08 NOTE — Progress Notes (Signed)
eLink Physician-Brief Progress Note Patient Name: Janet Dean DOB: 09-Feb-1945 MRN: [id number]   Date of Service  03/08/2020  HPI/Events of Note  Hyperkalemia - K+ = 5.4.   eICU Interventions  Plan: 1. Lokelma 10 gm per tube now.  2. Repeat BMP at 11 AM.     Intervention Category Major Interventions: Electrolyte abnormality - evaluation and management  Soleia Badolato Eugene 03/08/2020, 4:58 AM

## 2020-03-08 NOTE — Progress Notes (Signed)
[place] Progress Note Patient Name: Janet Dean DOB: 1945/02/08 MRN: [id number]   Date of Service  03/08/2020  HPI/Events of Note  ABG on 100%/PRVC 24/TV 300/P 16 = 7.042/85.9/67.1.  eICU Interventions  Plan: 1. Increase PRVC rate to 35. 2. NaHCO2 200 meq IV now. 3. NaHCO3 IV infusion to run IV at 125 mL/hour. 4. Repeat ABG at 8 AM.     Intervention Category Major Interventions: Acid-Base disturbance - evaluation and management;Respiratory failure - evaluation and management  Lysle Dingwall 03/08/2020, 4:49 AM

## 2020-03-08 NOTE — Progress Notes (Signed)
[place] Progress Note Patient Name: Janet Dean DOB: January 01, 1946 MRN: [id number]   Date of Service  03/08/2020  HPI/Events of Note  Hypoxia - Sat = 83% by oximetry.   eICU Interventions  Plan: 1. ABG and portable CXR STAT     Intervention Category Major Interventions: Hypoxemia - evaluation and management  Sommer,Steven Eugene 03/08/2020, 4:01 AM

## 2020-03-08 NOTE — Progress Notes (Signed)
NAME:  Janet Dean, MRN:  [id number], DOB:  1945/03/12, LOS: 1 ADMISSION DATE:  03/14/2020, CONSULTATION DATE: March 07, 2020 REFERRING MD: [facility], CHIEF COMPLAINT: ARDS COVID-19 infection  Brief History:  Patient with COVID-19 infection was +2 weeks ago went to [facility] was transferred here for family request  History of Present Illness:  Patient with 75 years old COVID-3 infection bilateral pneumonia intubated to 3 days ago here with family request for higher level of care already asked had a CAT scan at the other hospital with no PE and fibrosis bullae and emphysema she received remdesivir and Actemra she has multiple allergies.  Past Medical History:  COPD emphysema hypertension hyperlipidemia   Objective   Blood pressure (!) 170/53, pulse 95, temperature 97.9 F (36.6 C), temperature source Core, resp. rate (!) 35, height 5\' 2"  (1.575 m), weight 81.7 kg, SpO2 95 %. CVP:  [10 mmHg-11 mmHg] 11 mmHg  Vent Mode: PRVC FiO2 (%):  [100 %] 100 % Set Rate:  [24 bmp-35 bmp] 35 bmp Vt Set:  [300 mL] 300 mL PEEP:  [16 cmH20] 16 cmH20 Plateau Pressure:  [24 cmH20-28 cmH20] 25 cmH20   Intake/Output Summary (Last 24 hours) at 03/08/2020 1751 Last data filed at 03/08/2020 1708 Gross per 24 hour  Intake 3106.67 ml  Output 125 ml  Net 2981.67 ml   Filed Weights   04/02/2020 2102  Weight: 81.7 kg    Examination: General: Intubated sedated dyssynchronous with the vent Neuro: Intubated sedated chest  HEENT:  atraumatic , no jaundice , dry mucous membranes  Cardiovascular:  Tachy, no murmurs Lungs: Bilateral inspiratory crackles Abdomen:  Soft lax +BS , no tenderness .     Assessment & Plan:  COVID-19 infection with severe ARDS: P;F ratio in 38s. PCO2 high. --Full vent support, 6 cc/kg, PEEP 16 driving pressures surprisingly low 11, FiO2 100%, paralyzed then proned 3/4 during day with mild improvement in SpO2 --s/p actemra, wean steroids, received prior during ~10  day hospitalization at [facility]   Acute renal failure with oliguria: due to hypoxemia, low BP, ATN. Causing metabolic acidosis. --receiving IV fluids --Not dialysis candidate  Shock: sedation, hypovolemic. --IVF --MAP > 65, vasopressors as needed --Cefepime  DM: Hyperglycemic on steroids --SSI  GOC: Long conversation explaining condition. Given age, multi-system organ failure, and now 2+ weeks from initial infection the prognosis is grave. Almost certainly will die this admission. Discussed with daughter and her sister who is HCPOA. HCPOA does nto want her to suffer and says patient would not want all of this. Daughter agrees would not want prolonged life support. Re-evaluate in 48 hours, sooner if worsens. Recommend DNR which is agreed to, CODE STATUS changed. Recommend comfort care but wanting to wait a bit longer to see if there is improvement. Re-address 03/10/20.  Labs   CBC: Recent Labs  Lab 03/14/2020 2104 03/08/20 0440  WBC 16.1* 21.9*  NEUTROABS 14.5* 20.2*  HGB 13.5 12.9  HCT 46.1* 43.5  MCV 98.9 98.4  PLT 369 469*    Basic Metabolic Panel: Recent Labs  Lab 03/29/2020 2104 03/08/20 0440 03/08/20 1207  NA 134* 135 136  K 5.4* 5.3* 4.8  CL 99 99 92*  CO2 20* 23 28  GLUCOSE 124* 138* 235*  BUN 80* 85* 86*  CREATININE 4.17* 4.50* 4.37*  CALCIUM 7.7* 7.7* 7.4*  MG  --  3.0*  --    GFR: Estimated Creatinine Clearance: 11.5 mL/min (A) (by C-G formula based on SCr of 4.37 mg/dL (  H)). Recent Labs  Lab 04/04/2020 2104 03/08/20 0440  PROCALCITON 0.75 2.84  WBC 16.1* 21.9*  LATICACIDVEN 1.6  --     Liver Function Tests: Recent Labs  Lab 03/21/2020 2104 03/08/20 0440  AST 79* 73*  ALT 67* 60*  ALKPHOS 93 82  BILITOT 1.4* 1.5*  PROT 7.0 6.3*  ALBUMIN 3.2* 2.6*   No results for input(s): LIPASE, AMYLASE in the last 168 hours. No results for input(s): AMMONIA in the last 168 hours.  ABG    Component Value Date/Time   PHART 7.220 (L) 03/08/2020 1530    PCO2ART 78.4 (HH) 03/08/2020 1530   PO2ART 80.0 (L) 03/08/2020 1530   HCO3 30.9 (H) 03/08/2020 1530   ACIDBASEDEF 0.4 03/08/2020 1154   O2SAT 94.4 03/08/2020 1530     Coagulation Profile: No results for input(s): INR, PROTIME in the last 168 hours.  Cardiac Enzymes: No results for input(s): CKTOTAL, CKMB, CKMBINDEX, TROPONINI in the last 168 hours.  HbA1C: Hgb A1c MFr Bld  Date/Time Value Ref Range Status  03/23/2020 09:04 PM 6.6 (H) 4.8 - 5.6 % Final    Comment:    (NOTE) Pre diabetes:          5.7%-6.4%  Diabetes:              >6.4%  Glycemic control for   <7.0% adults with diabetes     CBG: Recent Labs  Lab 03/06/2020 2357 03/08/20 0350 03/08/20 0835 03/08/20 1202 03/08/20 1659  GLUCAP 100* 115* 173* 191* 166*    Review of Systems:   Unable to obtain due to patient condition  Past Medical History:  She,  has a past medical history of Anxiety, Arthritis, Asthma, GERD (gastroesophageal reflux disease), Gout, High blood pressure, Hypothyroidism, Kidney disease, and Tobacco abuse.   Surgical History:   Past Surgical History:  Procedure Laterality Date  . ANKLE FRACTURE SURGERY  2021   rods and pins in ankle - unsure which side  . OTHER SURGICAL HISTORY  1988   Tubal Ligation Reversal  . SINOSCOPY  1992  . TUBAL LIGATION       Social History:   reports that she quit smoking about 2 years ago. Her smoking use included cigarettes. She has never used smokeless tobacco. She reports that she does not drink alcohol and does not use drugs.   Family History:  Her family history includes Asthma in her maternal grandmother; Diabetes in her mother; Emphysema in her father; Heart disease in her mother; Transient ischemic attack in her mother.   Allergies Allergies  Allergen Reactions  . Other     Senior dose of flu vaccine caused rash and hives  . Penicillins Anaphylaxis  . Pantoprazole Sodium Other (See Comments)    UNKNOWN  . Prednisone Swelling    Tongue  swelling  . Avelox [Moxifloxacin Hcl In Nacl] Swelling and Rash  . Fluoxetine Rash and Other (See Comments)    Wheezing and Rash  . Kenalog [Triamcinolone Acetonide] Swelling and Rash  . Lipitor [Atorvastatin] Rash     Home Medications  Prior to Admission medications   Medication Sig Start Date End Date Taking? Authorizing Provider  Calcium Carbonate (CALCIUM 600 PO) Take 600 mg by mouth daily.    [provider]  celecoxib (CELEBREX) 200 MG capsule Take 200 mg by mouth daily. 01/31/15   [provider]  cetirizine (ZYRTEC) 10 MG tablet Take 10 mg by mouth daily as needed for allergies.    [provider]  colchicine 0.6 MG tablet Take 0.6 mg by mouth daily.    [provider]  COMBIVENT RESPIMAT 20-100 MCG/ACT AERS respimat INHALE ONE PUFF FOUR TIMES DAILY (MAX OF SIX PUFFS IN 24 hours) 01/04/15   [provider]  dexlansoprazole (DEXILANT) 60 MG capsule Take one capsule once every morning as directed for reflux. 04/10/15   Kozlow, Donnamarie Poag, MD  Fluticasone Furoate (ARNUITY ELLIPTA) 100 MCG/ACT AEPB Inhale 1 Dose into the lungs daily. Rinse, gargle, and spit after use. 04/10/15   Kozlow, Donnamarie Poag, MD  GuaiFENesin (MUCINEX PO) Take 400 mg by mouth as needed.    [provider]  IMIPRAMINE HCL PO Take 200 mg by mouth daily.    [provider]  levothyroxine (SYNTHROID, LEVOTHROID) 75 MCG tablet Take 75 mcg by mouth daily.    [provider]  losartan (COZAAR) 50 MG tablet Take 50 mg by mouth daily.    [provider]  Multiple Vitamins-Minerals (CENTRUM SILVER PO) Take 1 tablet by mouth daily.    [provider]  ranitidine (ZANTAC) 150 MG tablet Take 150 mg by mouth as needed for heartburn.    [provider]  simvastatin (ZOCOR) 10 MG tablet Take 10 mg by mouth daily.    [provider]  Vitamin D, Ergocalciferol, (DRISDOL) 50000 units CAPS capsule Take 50,000 Units by mouth once a week.     [provider]     Critical care time:    CRITICAL CARE Performed by: Lanier Clam   Total critical care time: 60 minutes  Critical care time was exclusive of separately billable procedures and treating other patients.  Critical care was necessary to treat or prevent imminent or life-threatening deterioration.  Critical care was time spent personally by me on the following activities: development of treatment plan with patient and/or surrogate as well as nursing, discussions with consultants, evaluation of patient's response to treatment, examination of patient, obtaining history from patient or surrogate, ordering and performing treatments and interventions, ordering and review of laboratory studies, ordering and review of radiographic studies, pulse oximetry and re-evaluation of patient's condition.

## 2020-03-08 NOTE — Progress Notes (Signed)
[place] Progress Note Patient Name: Janet Dean DOB: 18-Aug-1945 MRN: [id number]   Date of Service  03/08/2020  HPI/Events of Note  Admitting physician did not enter code status.   eICU Interventions  Plan: 1. Full Code status.      Intervention Category Major Interventions: Other:  Lucee Brissett Cornelia Copa 03/08/2020, 1:36 AM

## 2020-03-08 NOTE — Progress Notes (Signed)
Critical Value: pH- 7.042 CO2- 85.9  Date & Time Notified: 03/08/2020 04:43 am   Provider Notified: E-link notified

## 2020-03-08 NOTE — Progress Notes (Signed)
Critical Value: pH- 7.185 CO2- 79.1  Date & Time Notified: 03/08/2020 08:10 am    Provider Notified: Dr. Silas Flood

## 2020-03-09 DIAGNOSIS — N179 Acute kidney failure, unspecified: Secondary | ICD-10-CM | POA: Diagnosis not present

## 2020-03-09 DIAGNOSIS — J1282 Pneumonia due to coronavirus disease 2019: Secondary | ICD-10-CM | POA: Diagnosis not present

## 2020-03-09 DIAGNOSIS — U071 COVID-19: Secondary | ICD-10-CM | POA: Diagnosis not present

## 2020-03-09 DIAGNOSIS — J80 Acute respiratory distress syndrome: Secondary | ICD-10-CM | POA: Diagnosis not present

## 2020-03-09 LAB — CBC
HCT: 41.3 % (ref 36.0–46.0)
Hemoglobin: 12.9 g/dL (ref 12.0–15.0)
MCH: 29.1 pg (ref 26.0–34.0)
MCHC: 31.2 g/dL (ref 30.0–36.0)
MCV: 93 fL (ref 80.0–100.0)
Platelets: 432 10*3/uL — ABNORMAL HIGH (ref 150–400)
RBC: 4.44 MIL/uL (ref 3.87–5.11)
RDW: 14 % (ref 11.5–15.5)
WBC: 19.7 10*3/uL — ABNORMAL HIGH (ref 4.0–10.5)
nRBC: 0.1 % (ref 0.0–0.2)

## 2020-03-09 LAB — COMPREHENSIVE METABOLIC PANEL
ALT: 86 U/L — ABNORMAL HIGH (ref 0–44)
AST: 111 U/L — ABNORMAL HIGH (ref 15–41)
Albumin: 2.4 g/dL — ABNORMAL LOW (ref 3.5–5.0)
Alkaline Phosphatase: 91 U/L (ref 38–126)
Anion gap: 20 — ABNORMAL HIGH (ref 5–15)
BUN: 94 mg/dL — ABNORMAL HIGH (ref 8–23)
CO2: 32 mmol/L (ref 22–32)
Calcium: 7.4 mg/dL — ABNORMAL LOW (ref 8.9–10.3)
Chloride: 88 mmol/L — ABNORMAL LOW (ref 98–111)
Creatinine, Ser: 4.11 mg/dL — ABNORMAL HIGH (ref 0.44–1.00)
GFR, Estimated: 11 mL/min — ABNORMAL LOW (ref >60)
Glucose, Bld: 106 mg/dL — ABNORMAL HIGH (ref 70–99)
Potassium: 4.3 mmol/L (ref 3.5–5.1)
Sodium: 140 mmol/L (ref 135–145)
Total Bilirubin: 1.5 mg/dL — ABNORMAL HIGH (ref 0.3–1.2)
Total Protein: 6.9 g/dL (ref 6.5–8.1)

## 2020-03-09 LAB — GLUCOSE, CAPILLARY
Glucose-Capillary: 115 mg/dL — ABNORMAL HIGH (ref 70–99)
Glucose-Capillary: 99 mg/dL (ref 70–99)
Glucose-Capillary: 107 mg/dL — ABNORMAL HIGH (ref 70–99)
Glucose-Capillary: 89 mg/dL (ref 70–99)
Glucose-Capillary: 102 mg/dL — ABNORMAL HIGH (ref 70–99)

## 2020-03-09 LAB — PROCALCITONIN: Procalcitonin: 4.28 ng/mL

## 2020-03-09 LAB — MAGNESIUM: Magnesium: 3.1 mg/dL — ABNORMAL HIGH (ref 1.7–2.4)

## 2020-03-09 MED ORDER — PROSOURCE TF PO LIQD
45.0000 mL | Freq: Two times a day (BID) | ORAL | Status: DC
  Administered 2020-03-09 – 2020-03-11 (×4): 45 mL
  Filled 2020-03-09 (×4): qty 45

## 2020-03-09 MED ORDER — HYDRALAZINE HCL 20 MG/ML IJ SOLN
10.0000 mg | INTRAMUSCULAR | Status: DC | PRN
  Administered 2020-03-09: 10 mg via INTRAVENOUS
  Filled 2020-03-09: qty 1

## 2020-03-09 MED ORDER — CHLORHEXIDINE GLUCONATE 0.12 % MT SOLN
OROMUCOSAL | Status: AC
  Administered 2020-03-09: 15 mL via OROMUCOSAL
  Filled 2020-03-09: qty 15

## 2020-03-09 MED ORDER — VITAL HIGH PROTEIN PO LIQD
1000.0000 mL | ORAL | Status: DC
  Administered 2020-03-09 – 2020-03-10 (×2): 1000 mL

## 2020-03-09 NOTE — Progress Notes (Signed)
eLink Physician-Brief Progress Note Patient Name: Janet Dean DOB: Aug 07, 1945 MRN: [id number]   Date of Service  03/09/2020  HPI/Events of Note  Elevated BP, foley in place without an order, patient needs a.m. labs.  eICU Interventions  PRN Hydralazine 10 mg ordered, Foley ordered, a.m. labs ordered.        Okoronkwo U Ogan 03/09/2020, 3:30 AM

## 2020-03-09 NOTE — Progress Notes (Signed)
NAME:  Amarianna Abplanalp, MRN:  [id number], DOB:  February 10, 1945, LOS: 2 ADMISSION DATE:  03/14/2020, CONSULTATION DATE: March 07, 2020 REFERRING MD: [facility], CHIEF COMPLAINT: ARDS COVID-19 infection  Brief History:  Patient with COVID-19 infection was +2 weeks ago went to [facility] was transferred here for family request  History of Present Illness:  Patient with 75 years old COVID-56 infection bilateral pneumonia intubated to 3 days ago here with family request for higher level of care already asked had a CAT scan at the other hospital with no PE and fibrosis bullae and emphysema she received remdesivir and Actemra she has multiple allergies.  Past Medical History:  COPD emphysema hypertension hyperlipidemia   Objective   Blood pressure (!) 109/47, pulse (!) 104, temperature (!) 97 F (36.1 C), temperature source Bladder, resp. rate (!) 35, height 5\' 2"  (1.575 m), weight 81.7 kg, SpO2 92 %. CVP:  [6 mmHg-24 mmHg] 6 mmHg  Vent Mode: PRVC FiO2 (%):  [100 %] 100 % Set Rate:  [16 bmp-35 bmp] 16 bmp Vt Set:  [300 mL] 300 mL PEEP:  [16 cmH20] 16 cmH20 Plateau Pressure:  [13 cmH20-27 cmH20] 13 cmH20   Intake/Output Summary (Last 24 hours) at 03/09/2020 0945 Last data filed at 03/09/2020 9470 Gross per 24 hour  Intake 3713.67 ml  Output 1100 ml  Net 2613.67 ml   Filed Weights   03/08/2020 2102  Weight: 81.7 kg    Examination: General: Intubated sedated dyssynchronous with the vent Neuro: Intubated sedated chest  HEENT:  atraumatic , no jaundice , dry mucous membranes  Cardiovascular:  Tachy, no murmurs Lungs: Bilateral inspiratory crackles Abdomen:  +BS , no tenderness .  OVERNIGHT EVENTS: NAEON. Placed back supine, O2 sats remain low 90s.   Assessment & Plan:  COVID-19 infection with severe ARDS: P:F ratio in 49s. PCO2 high. Likely developing fibrosis. --Full vent support, 6 cc/kg, PEEP 16, FiO2 100%, paralyzed then proned 3/4 during day with mild improvement in  SpO2 --No more proning (only mild improvement P:F ratio), continue paralytic --s/p actemra, stop steroids, received prior during ~10 day hospitalization at [place]   Acute renal failure with oliguria: due to hypoxemia, low BP, ATN. Causing metabolic acidosis. --receiving IV fluids --Not dialysis candidate  Shock: sedation, hypovolemic. --IVF --MAP > 65, vasopressors as needed --Cefepime  DM: Hyperglycemic on steroids --SSI  GOC: 03/08/2020 - Long conversation explaining condition. Given age, multi-system organ failure, and now 2+ weeks from initial infection the prognosis is grave. Almost certainly will die this admission. Discussed with daughter and her sister who is HCPOA. HCPOA does nto want her to suffer and says patient would not want all of this. Daughter agrees would not want prolonged life support. Re-evaluate in 48 hours, sooner if worsens. Recommend DNR which is agreed to, CODE STATUS changed. Recommend comfort care but wanting to wait a bit longer to see if there is improvement. Re-address 03/10/20.  Labs   CBC: Recent Labs  Lab 03/10/2020 2104 03/08/20 0440 03/09/20 0635  WBC 16.1* 21.9* 19.7*  NEUTROABS 14.5* 20.2*  --   HGB 13.5 12.9 12.9  HCT 46.1* 43.5 41.3  MCV 98.9 98.4 93.0  PLT 369 469* 432*    Basic Metabolic Panel: Recent Labs  Lab 03/28/2020 2104 03/08/20 0440 03/08/20 1207 03/09/20 0635  NA 134* 135 136 140  K 5.4* 5.3* 4.8 4.3  CL 99 99 92* 88*  CO2 20* 23 28 32  GLUCOSE 124* 138* 235* 106*  BUN 80* 85* 86*  94*  CREATININE 4.17* 4.50* 4.37* 4.11*  CALCIUM 7.7* 7.7* 7.4* 7.4*  MG  --  3.0*  --  3.1*   GFR: Estimated Creatinine Clearance: 12.2 mL/min (A) (by C-G formula based on SCr of 4.11 mg/dL (H)). Recent Labs  Lab 04/02/2020 2104 03/08/20 0440 03/09/20 0510 03/09/20 0635  PROCALCITON 0.75 2.84 4.28  --   WBC 16.1* 21.9*  --  19.7*  LATICACIDVEN 1.6  --   --   --     Liver Function Tests: Recent Labs  Lab 03/10/2020 2104 03/08/20 0440  03/09/20 0635  AST 79* 73* 111*  ALT 67* 60* 86*  ALKPHOS 93 82 91  BILITOT 1.4* 1.5* 1.5*  PROT 7.0 6.3* 6.9  ALBUMIN 3.2* 2.6* 2.4*   No results for input(s): LIPASE, AMYLASE in the last 168 hours. No results for input(s): AMMONIA in the last 168 hours.  ABG    Component Value Date/Time   PHART 7.220 (L) 03/08/2020 1530   PCO2ART 78.4 (HH) 03/08/2020 1530   PO2ART 80.0 (L) 03/08/2020 1530   HCO3 30.9 (H) 03/08/2020 1530   ACIDBASEDEF 0.4 03/08/2020 1154   O2SAT 94.4 03/08/2020 1530     Coagulation Profile: No results for input(s): INR, PROTIME in the last 168 hours.  Cardiac Enzymes: No results for input(s): CKTOTAL, CKMB, CKMBINDEX, TROPONINI in the last 168 hours.  HbA1C: Hgb A1c MFr Bld  Date/Time Value Ref Range Status  04/01/2020 09:04 PM 6.6 (H) 4.8 - 5.6 % Final    Comment:    (NOTE) Pre diabetes:          5.7%-6.4%  Diabetes:              >6.4%  Glycemic control for   <7.0% adults with diabetes     CBG: Recent Labs  Lab 03/08/20 1659 03/08/20 1936 03/08/20 2257 03/09/20 0321 03/09/20 0855  GLUCAP 166* 146* 145* 115* 99    Review of Systems:   Unable to obtain due to patient condition  Past Medical History:  She,  has a past medical history of Anxiety, Arthritis, Asthma, GERD (gastroesophageal reflux disease), Gout, High blood pressure, Hypothyroidism, Kidney disease, and Tobacco abuse.   Surgical History:   Past Surgical History:  Procedure Laterality Date  . ANKLE FRACTURE SURGERY  2021   rods and pins in ankle - unsure which side  . OTHER SURGICAL HISTORY  1988   Tubal Ligation Reversal  . SINOSCOPY  1992  . TUBAL LIGATION       Social History:   reports that she quit smoking about 2 years ago. Her smoking use included cigarettes. She has never used smokeless tobacco. She reports that she does not drink alcohol and does not use drugs.   Family History:  Her family history includes Asthma in her maternal grandmother; Diabetes in  her mother; Emphysema in her father; Heart disease in her mother; Transient ischemic attack in her mother.   Allergies Allergies  Allergen Reactions  . Other     Senior dose of flu vaccine caused rash and hives  . Penicillins Anaphylaxis  . Pantoprazole Sodium Other (See Comments)    UNKNOWN  . Prednisone Swelling    Tongue swelling  . Avelox [Moxifloxacin Hcl In Nacl] Swelling and Rash  . Fluoxetine Rash and Other (See Comments)    Wheezing and Rash  . Kenalog [Triamcinolone Acetonide] Swelling and Rash  . Lipitor [Atorvastatin] Rash     Home Medications  Prior to Admission medications   Medication  Sig Start Date End Date Taking? Authorizing Provider  Calcium Carbonate (CALCIUM 600 PO) Take 600 mg by mouth daily.    [provider]  celecoxib (CELEBREX) 200 MG capsule Take 200 mg by mouth daily. 01/31/15   [provider]  cetirizine (ZYRTEC) 10 MG tablet Take 10 mg by mouth daily as needed for allergies.    [provider]  colchicine 0.6 MG tablet Take 0.6 mg by mouth daily.    [provider]  COMBIVENT RESPIMAT 20-100 MCG/ACT AERS respimat INHALE ONE PUFF FOUR TIMES DAILY (MAX OF SIX PUFFS IN 24 hours) 01/04/15   [provider]  dexlansoprazole (DEXILANT) 60 MG capsule Take one capsule once every morning as directed for reflux. 04/10/15   Kozlow, Donnamarie Poag, MD  Fluticasone Furoate (ARNUITY ELLIPTA) 100 MCG/ACT AEPB Inhale 1 Dose into the lungs daily. Rinse, gargle, and spit after use. 04/10/15   Kozlow, Donnamarie Poag, MD  GuaiFENesin (MUCINEX PO) Take 400 mg by mouth as needed.    [provider]  IMIPRAMINE HCL PO Take 200 mg by mouth daily.    [provider]  levothyroxine (SYNTHROID, LEVOTHROID) 75 MCG tablet Take 75 mcg by mouth daily.    [provider]  losartan (COZAAR) 50 MG tablet Take 50 mg by mouth daily.    [provider]  Multiple Vitamins-Minerals (CENTRUM SILVER PO) Take 1 tablet by mouth daily.     [provider]  ranitidine (ZANTAC) 150 MG tablet Take 150 mg by mouth as needed for heartburn.    [provider]  simvastatin (ZOCOR) 10 MG tablet Take 10 mg by mouth daily.    [provider]  Vitamin D, Ergocalciferol, (DRISDOL) 50000 units CAPS capsule Take 50,000 Units by mouth once a week.    [provider]     Critical care time:    CRITICAL CARE Performed by: Lanier Clam   Total critical care time: 33 minutes  Critical care time was exclusive of separately billable procedures and treating other patients.  Critical care was necessary to treat or prevent imminent or life-threatening deterioration.  Critical care was time spent personally by me on the following activities: development of treatment plan with patient and/or surrogate as well as nursing, discussions with consultants, evaluation of patient's response to treatment, examination of patient, obtaining history from patient or surrogate, ordering and performing treatments and interventions, ordering and review of laboratory studies, ordering and review of radiographic studies, pulse oximetry and re-evaluation of patient's condition.

## 2020-03-09 NOTE — Progress Notes (Addendum)
Patient with increased blood pressure. See flow sheet for details.  E-link notified. Awaiting orders. Will continue to monitor patient.

## 2020-03-10 DIAGNOSIS — J80 Acute respiratory distress syndrome: Secondary | ICD-10-CM | POA: Diagnosis not present

## 2020-03-10 DIAGNOSIS — N179 Acute kidney failure, unspecified: Secondary | ICD-10-CM | POA: Diagnosis not present

## 2020-03-10 DIAGNOSIS — J9601 Acute respiratory failure with hypoxia: Secondary | ICD-10-CM | POA: Diagnosis not present

## 2020-03-10 DIAGNOSIS — U071 COVID-19: Secondary | ICD-10-CM | POA: Diagnosis not present

## 2020-03-10 LAB — BASIC METABOLIC PANEL
Anion gap: 20 — ABNORMAL HIGH (ref 5–15)
BUN: 115 mg/dL — ABNORMAL HIGH (ref 8–23)
CO2: 44 mmol/L — ABNORMAL HIGH (ref 22–32)
Calcium: 6.8 mg/dL — ABNORMAL LOW (ref 8.9–10.3)
Chloride: 78 mmol/L — ABNORMAL LOW (ref 98–111)
Creatinine, Ser: 3.91 mg/dL — ABNORMAL HIGH (ref 0.44–1.00)
GFR, Estimated: 12 mL/min — ABNORMAL LOW (ref >60)
Glucose, Bld: 200 mg/dL — ABNORMAL HIGH (ref 70–99)
Potassium: 4.2 mmol/L (ref 3.5–5.1)
Sodium: 142 mmol/L (ref 135–145)

## 2020-03-10 LAB — BLOOD GAS, ARTERIAL
Acid-Base Excess: 13.9 mmol/L — ABNORMAL HIGH (ref 0.0–2.0)
Bicarbonate: 44.7 mmol/L — ABNORMAL HIGH (ref 20.0–28.0)
Drawn by: 61147
FIO2: 100
MECHVT: 300 mL
O2 Saturation: 88.4 %
PEEP: 16 cmH2O
Patient temperature: 98.6
RATE: 35 resp/min
pCO2 arterial: 105 mmHg (ref 32.0–48.0)
pH, Arterial: 7.252 — ABNORMAL LOW (ref 7.350–7.450)
pO2, Arterial: 64.9 mmHg — ABNORMAL LOW (ref 83.0–108.0)

## 2020-03-10 LAB — GLUCOSE, CAPILLARY
Glucose-Capillary: 162 mg/dL — ABNORMAL HIGH (ref 70–99)
Glucose-Capillary: 163 mg/dL — ABNORMAL HIGH (ref 70–99)
Glucose-Capillary: 164 mg/dL — ABNORMAL HIGH (ref 70–99)
Glucose-Capillary: 178 mg/dL — ABNORMAL HIGH (ref 70–99)
Glucose-Capillary: 183 mg/dL — ABNORMAL HIGH (ref 70–99)
Glucose-Capillary: 189 mg/dL — ABNORMAL HIGH (ref 70–99)
Glucose-Capillary: 203 mg/dL — ABNORMAL HIGH (ref 70–99)

## 2020-03-10 MED ORDER — FENTANYL 2500MCG IN NS 250ML (10MCG/ML) PREMIX INFUSION
25.0000 ug/h | INTRAVENOUS | Status: DC
  Administered 2020-03-10: 50 ug/h via INTRAVENOUS
  Filled 2020-03-10: qty 250

## 2020-03-10 MED ORDER — SODIUM CHLORIDE 0.9% FLUSH
10.0000 mL | Freq: Two times a day (BID) | INTRAVENOUS | Status: DC
  Administered 2020-03-10 – 2020-03-11 (×3): 10 mL

## 2020-03-10 MED ORDER — SODIUM CHLORIDE 0.9% FLUSH: 10.0000 mL | INTRAVENOUS | Status: DC | PRN

## 2020-03-10 NOTE — Progress Notes (Signed)
NAME:  Terrisha Lopata, MRN:  [id number], DOB:  01-12-1945, LOS: 3 ADMISSION DATE:  03/17/2020, CONSULTATION DATE: March 07, 2020 REFERRING MD: [facility], CHIEF COMPLAINT: ARDS COVID-19 infection  Brief History:  Patient with COVID-19 infection was +2 weeks ago went to [facility] was transferred here for family request  History of Present Illness:  Patient with 75 years old COVID-31 infection bilateral pneumonia intubated to 3 days ago here with family request for higher level of care already asked had a CAT scan at the other hospital with no PE and fibrosis bullae and emphysema she received remdesivir and Actemra she has multiple allergies.  Past Medical History:  COPD emphysema hypertension hyperlipidemia   Objective   Blood pressure (!) 110/39, pulse (!) 101, temperature 99.7 F (37.6 C), temperature source Bladder, resp. rate (!) 35, height 5\' 2"  (1.575 m), weight 81.7 kg, SpO2 90 %. CVP:  [4 mmHg-10 mmHg] 10 mmHg  Vent Mode: PRVC FiO2 (%):  [100 %] 100 % Set Rate:  [35 bmp] 35 bmp Vt Set:  [300 mL] 300 mL PEEP:  [16 cmH20] 16 cmH20 Plateau Pressure:  [23 cmH20-26 cmH20] 23 cmH20   Intake/Output Summary (Last 24 hours) at 03/10/2020 0932 Last data filed at 03/10/2020 0908 Gross per 24 hour  Intake 3463.75 ml  Output 1040 ml  Net 2423.75 ml   Filed Weights   03/06/2020 2102  Weight: 81.7 kg    Examination: General: Intubated sedated dyssynchronous with the vent Neuro: Intubated sedated chest  HEENT:  atraumatic , no jaundice , dry mucous membranes  Cardiovascular:  Tachy, no murmurs Lungs: Bilateral inspiratory crackles Abdomen:  +BS , no tenderness .  OVERNIGHT EVENTS: NAEON. Worsening CO@, kidney function seems to be improving.    Assessment & Plan:  COVID-19 infection with severe ARDS: P:F ratio in 75s. PCO2 high and climbing. Likely developing fibrosis, worsening dead space, ominous. --Full vent support, 6 cc/kg, PEEP 16, FiO2 100%, paralyzed then  proned 3/4 during day with mild improvement in SpO2 --No more proning (only mild improvement P:F ratio), continue paralytic --s/p actemra, stop steroids, received prior during ~10 day hospitalization at [place]   Acute renal failure with oliguria: due to hypoxemia, low BP, ATN. Causing metabolic acidosis. Improving. --receiving IV fluids --Not dialysis candidate  Shock - resolved: sedation, hypovolemic. --IVF --MAP > 65, vasopressors as needed --Cefepime  DM: Hyperglycemic on steroids --SSI  GOC: 03/08/2020 - Long conversation explaining condition. Given age, multi-system organ failure, and now 2+ weeks from initial infection the prognosis is grave. Almost certainly will die this admission. Discussed with daughter and her sister who is HCPOA. HCPOA does nto want her to suffer and says patient would not want all of this. Daughter agrees would not want prolonged life support. Re-evaluate in 48 hours, sooner if worsens. Recommend DNR which is agreed to, CODE STATUS changed. Recommend comfort care but wanting to wait a bit longer to see if there is improvement. Re-address 03/10/20.  Labs   CBC: Recent Labs  Lab 03/13/2020 2104 03/08/20 0440 03/09/20 0635  WBC 16.1* 21.9* 19.7*  NEUTROABS 14.5* 20.2*  --   HGB 13.5 12.9 12.9  HCT 46.1* 43.5 41.3  MCV 98.9 98.4 93.0  PLT 369 469* 432*    Basic Metabolic Panel: Recent Labs  Lab 03/26/2020 2104 03/08/20 0440 03/08/20 1207 03/09/20 0635  NA 134* 135 136 140  K 5.4* 5.3* 4.8 4.3  CL 99 99 92* 88*  CO2 20* 23 28 32  GLUCOSE 124*  138* 235* 106*  BUN 80* 85* 86* 94*  CREATININE 4.17* 4.50* 4.37* 4.11*  CALCIUM 7.7* 7.7* 7.4* 7.4*  MG  --  3.0*  --  3.1*   GFR: Estimated Creatinine Clearance: 12.2 mL/min (A) (by C-G formula based on SCr of 4.11 mg/dL (H)). Recent Labs  Lab 03/14/2020 2104 03/08/20 0440 03/09/20 0510 03/09/20 0635  PROCALCITON 0.75 2.84 4.28  --   WBC 16.1* 21.9*  --  19.7*  LATICACIDVEN 1.6  --   --   --      Liver Function Tests: Recent Labs  Lab 03/27/2020 2104 03/08/20 0440 03/09/20 0635  AST 79* 73* 111*  ALT 67* 60* 86*  ALKPHOS 93 82 91  BILITOT 1.4* 1.5* 1.5*  PROT 7.0 6.3* 6.9  ALBUMIN 3.2* 2.6* 2.4*   No results for input(s): LIPASE, AMYLASE in the last 168 hours. No results for input(s): AMMONIA in the last 168 hours.  ABG    Component Value Date/Time   PHART 7.252 (L) 03/10/2020 0505   PCO2ART 105 (HH) 03/10/2020 0505   PO2ART 64.9 (L) 03/10/2020 0505   HCO3 44.7 (H) 03/10/2020 0505   ACIDBASEDEF 0.4 03/08/2020 1154   O2SAT 88.4 03/10/2020 0505     Coagulation Profile: No results for input(s): INR, PROTIME in the last 168 hours.  Cardiac Enzymes: No results for input(s): CKTOTAL, CKMB, CKMBINDEX, TROPONINI in the last 168 hours.  HbA1C: Hgb A1c MFr Bld  Date/Time Value Ref Range Status  03/05/2020 09:04 PM 6.6 (H) 4.8 - 5.6 % Final    Comment:    (NOTE) Pre diabetes:          5.7%-6.4%  Diabetes:              >6.4%  Glycemic control for   <7.0% adults with diabetes     CBG: Recent Labs  Lab 03/09/20 1645 03/09/20 1943 03/10/20 0026 03/10/20 0351 03/10/20 0817  GLUCAP 89 102* 189* 203* 162*    Review of Systems:   Unable to obtain due to patient condition  Past Medical History:  She,  has a past medical history of Anxiety, Arthritis, Asthma, GERD (gastroesophageal reflux disease), Gout, High blood pressure, Hypothyroidism, Kidney disease, and Tobacco abuse.   Surgical History:   Past Surgical History:  Procedure Laterality Date   ANKLE FRACTURE SURGERY  2021   rods and pins in ankle - unsure which side   OTHER SURGICAL HISTORY  1988   Tubal Ligation Reversal   SINOSCOPY  1992   TUBAL LIGATION       Social History:   reports that she quit smoking about 2 years ago. Her smoking use included cigarettes. She has never used smokeless tobacco. She reports that she does not drink alcohol and does not use drugs.   Family History:  Her  family history includes Asthma in her maternal grandmother; Diabetes in her mother; Emphysema in her father; Heart disease in her mother; Transient ischemic attack in her mother.   Allergies Allergies  Allergen Reactions   Other     Senior dose of flu vaccine caused rash and hives   Penicillins Anaphylaxis   Pantoprazole Sodium Other (See Comments)    UNKNOWN   Prednisone Swelling    Tongue swelling   Avelox [Moxifloxacin Hcl In Nacl] Swelling and Rash   Fluoxetine Rash and Other (See Comments)    Wheezing and Rash   Kenalog [Triamcinolone Acetonide] Swelling and Rash   Lipitor [Atorvastatin] Rash     Home Medications  Prior to Admission medications   Medication Sig Start Date End Date Taking? Authorizing Provider  Calcium Carbonate (CALCIUM 600 PO) Take 600 mg by mouth daily.    [provider]  celecoxib (CELEBREX) 200 MG capsule Take 200 mg by mouth daily. 01/31/15   [provider]  cetirizine (ZYRTEC) 10 MG tablet Take 10 mg by mouth daily as needed for allergies.    [provider]  colchicine 0.6 MG tablet Take 0.6 mg by mouth daily.    [provider]  COMBIVENT RESPIMAT 20-100 MCG/ACT AERS respimat INHALE ONE PUFF FOUR TIMES DAILY (MAX OF SIX PUFFS IN 24 hours) 01/04/15   [provider]  dexlansoprazole (DEXILANT) 60 MG capsule Take one capsule once every morning as directed for reflux. 04/10/15   Kozlow, Donnamarie Poag, MD  Fluticasone Furoate (ARNUITY ELLIPTA) 100 MCG/ACT AEPB Inhale 1 Dose into the lungs daily. Rinse, gargle, and spit after use. 04/10/15   Kozlow, Donnamarie Poag, MD  GuaiFENesin (MUCINEX PO) Take 400 mg by mouth as needed.    [provider]  IMIPRAMINE HCL PO Take 200 mg by mouth daily.    [provider]  levothyroxine (SYNTHROID, LEVOTHROID) 75 MCG tablet Take 75 mcg by mouth daily.    [provider]  losartan (COZAAR) 50 MG tablet Take 50 mg by mouth daily.    [provider]  Multiple  Vitamins-Minerals (CENTRUM SILVER PO) Take 1 tablet by mouth daily.    [provider]  ranitidine (ZANTAC) 150 MG tablet Take 150 mg by mouth as needed for heartburn.    [provider]  simvastatin (ZOCOR) 10 MG tablet Take 10 mg by mouth daily.    [provider]  Vitamin D, Ergocalciferol, (DRISDOL) 50000 units CAPS capsule Take 50,000 Units by mouth once a week.    [provider]     Critical care time:    CRITICAL CARE Performed by: Lanier Clam   Total critical care time: 35 minutes  Critical care time was exclusive of separately billable procedures and treating other patients.  Critical care was necessary to treat or prevent imminent or life-threatening deterioration.  Critical care was time spent personally by me on the following activities: development of treatment plan with patient and/or surrogate as well as nursing, discussions with consultants, evaluation of patient's response to treatment, examination of patient, obtaining history from patient or surrogate, ordering and performing treatments and interventions, ordering and review of laboratory studies, ordering and review of radiographic studies, pulse oximetry and re-evaluation of patient's condition.

## 2020-03-10 NOTE — Progress Notes (Signed)
Pharmacy Antibiotic Note  Janet Dean is a 75 y.o. female transferred from [facility] on 03/10/2020 with bilateral pneumonia, ARDS due to COVID s/p remdesivir, baricitinib, ceftriaxone, and azithromycin. Pharmacy has been consulted for Cefepime dosing. Patient has penicillin allergy (anaphylaxis), but received Ceftriaxone 02/27/20-03/04/20 at [facility] without complication.   SCr remains elevated at 3.91, AFebrile, WBC elevated  Plan: Cefepime 2g IV q24h Monitor renal function, cultures, clinical course  Height: 5\' 2"  (157.5 cm) Weight:  (UTA, bed scale not working) IBW/kg (Calculated) : 50.1  Temp (24hrs), Avg:98.6 F (37 C), Min:97.2 F (36.2 C), Max:99.7 F (37.6 C)  Recent Labs  Lab 03/17/2020 2104 03/08/20 0440 03/08/20 1207 03/09/20 0635 03/10/20 0856  WBC 16.1* 21.9*  --  19.7*  --   CREATININE 4.17* 4.50* 4.37* 4.11* 3.91*  LATICACIDVEN 1.6  --   --   --   --     Estimated Creatinine Clearance: 12.9 mL/min (A) (by C-G formula based on SCr of 3.91 mg/dL (H)).    Allergies  Allergen Reactions  . Other     Senior dose of flu vaccine caused rash and hives  . Penicillins Anaphylaxis  . Pantoprazole Sodium Other (See Comments)    UNKNOWN  . Prednisone Swelling    Tongue swelling  . Avelox [Moxifloxacin Hcl In Nacl] Swelling and Rash  . Fluoxetine Rash and Other (See Comments)    Wheezing and Rash  . Kenalog [Triamcinolone Acetonide] Swelling and Rash  . Lipitor [Atorvastatin] Rash    Antimicrobials this admission: 3/3 Cefepime >>  Dose adjustments this admission: --  Microbiology results: 3/4 Tracheal Aspirate: reincubated 3/3 MRSA PCR: negative   Thank you for allowing pharmacy to be a part of this patient's care.  Gretta Arab PharmD, BCPS Clinical Pharmacist [facility] main pharmacy [phone] 03/10/2020 11:00 AM

## 2020-03-10 NOTE — Progress Notes (Signed)
Date and time results received: 03/10/20; 05:27   Critical Value: pCO2=105  Name of Provider Notified: E-link  Awaiting for order

## 2020-03-11 ENCOUNTER — Inpatient Hospital Stay (HOSPITAL_COMMUNITY): Payer: PPO

## 2020-03-11 ENCOUNTER — Other Ambulatory Visit: Payer: Self-pay

## 2020-03-11 DIAGNOSIS — R579 Shock, unspecified: Secondary | ICD-10-CM

## 2020-03-11 DIAGNOSIS — Z515 Encounter for palliative care: Secondary | ICD-10-CM

## 2020-03-11 DIAGNOSIS — Z66 Do not resuscitate: Secondary | ICD-10-CM

## 2020-03-11 DIAGNOSIS — J80 Acute respiratory distress syndrome: Secondary | ICD-10-CM | POA: Diagnosis not present

## 2020-03-11 DIAGNOSIS — U071 COVID-19: Secondary | ICD-10-CM | POA: Diagnosis not present

## 2020-03-11 LAB — GLUCOSE, CAPILLARY
Glucose-Capillary: 160 mg/dL — ABNORMAL HIGH (ref 70–99)
Glucose-Capillary: 166 mg/dL — ABNORMAL HIGH (ref 70–99)
Glucose-Capillary: 182 mg/dL — ABNORMAL HIGH (ref 70–99)

## 2020-03-11 LAB — MAGNESIUM: Magnesium: 3.3 mg/dL — ABNORMAL HIGH (ref 1.7–2.4)

## 2020-03-11 LAB — BLOOD GAS, ARTERIAL
Acid-Base Excess: 23 mmol/L — ABNORMAL HIGH (ref 0.0–2.0)
Bicarbonate: 54.4 mmol/L — ABNORMAL HIGH (ref 20.0–28.0)
Drawn by: 225631
FIO2: 100
MECHVT: 300 mL
O2 Saturation: 64.3 %
PEEP: 16 cmH2O
Patient temperature: 99.5
RATE: 35 resp/min
pCO2 arterial: 127 mmHg (ref 32.0–48.0)
pH, Arterial: 7.259 — ABNORMAL LOW (ref 7.350–7.450)
pO2, Arterial: 41 mmHg — ABNORMAL LOW (ref 83.0–108.0)

## 2020-03-11 LAB — BASIC METABOLIC PANEL
Anion gap: 20 — ABNORMAL HIGH (ref 5–15)
BUN: 141 mg/dL — ABNORMAL HIGH (ref 8–23)
CO2: 45 mmol/L — ABNORMAL HIGH (ref 22–32)
Calcium: 7 mg/dL — ABNORMAL LOW (ref 8.9–10.3)
Chloride: 75 mmol/L — ABNORMAL LOW (ref 98–111)
Creatinine, Ser: 4.16 mg/dL — ABNORMAL HIGH (ref 0.44–1.00)
GFR, Estimated: 11 mL/min — ABNORMAL LOW (ref >60)
Glucose, Bld: 180 mg/dL — ABNORMAL HIGH (ref 70–99)
Potassium: 4.4 mmol/L (ref 3.5–5.1)
Sodium: 140 mmol/L (ref 135–145)

## 2020-03-11 LAB — CBC
HCT: 34.1 % — ABNORMAL LOW (ref 36.0–46.0)
Hemoglobin: 10.2 g/dL — ABNORMAL LOW (ref 12.0–15.0)
MCH: 29.2 pg (ref 26.0–34.0)
MCHC: 29.9 g/dL — ABNORMAL LOW (ref 30.0–36.0)
MCV: 97.7 fL (ref 80.0–100.0)
Platelets: 260 10*3/uL (ref 150–400)
RBC: 3.49 MIL/uL — ABNORMAL LOW (ref 3.87–5.11)
RDW: 14.5 % (ref 11.5–15.5)
WBC: 9 10*3/uL (ref 4.0–10.5)
nRBC: 0.3 % — ABNORMAL HIGH (ref 0.0–0.2)

## 2020-03-11 LAB — CULTURE, RESPIRATORY W GRAM STAIN: Culture: NORMAL

## 2020-03-11 LAB — TRIGLYCERIDES: Triglycerides: 549 mg/dL — ABNORMAL HIGH (ref <150)

## 2020-03-11 MED ORDER — FENTANYL CITRATE (PF) 100 MCG/2ML IJ SOLN
50.0000 ug | INTRAMUSCULAR | Status: DC | PRN
Start: 2020-03-11 — End: 2020-03-11

## 2020-03-11 MED ORDER — FENTANYL 2500MCG IN NS 250ML (10MCG/ML) PREMIX INFUSION
0.0000 ug/h | INTRAVENOUS | Status: DC
  Administered 2020-03-11: 150 ug/h via INTRAVENOUS

## 2020-03-11 MED ORDER — DIPHENHYDRAMINE HCL 50 MG/ML IJ SOLN: 25.0000 mg | INTRAMUSCULAR | Status: DC | PRN

## 2020-03-11 MED ORDER — MIDAZOLAM HCL 2 MG/2ML IJ SOLN: 2.0000 mg | INTRAMUSCULAR | Status: DC | PRN

## 2020-03-11 MED ORDER — POLYVINYL ALCOHOL 1.4 % OP SOLN
1.0000 [drp] | Freq: Four times a day (QID) | OPHTHALMIC | Status: DC | PRN
  Filled 2020-03-11: qty 15

## 2020-03-11 MED ORDER — FENTANYL BOLUS VIA INFUSION
100.0000 ug | INTRAVENOUS | Status: DC | PRN
  Filled 2020-03-11: qty 100

## 2020-03-11 MED ORDER — NOREPINEPHRINE 16 MG/250ML-% IV SOLN
0.0000 ug/min | INTRAVENOUS | Status: DC
  Administered 2020-03-11: 22 ug/min via INTRAVENOUS
  Filled 2020-03-11: qty 250

## 2020-03-11 MED ORDER — DEXTROSE 5 % IV SOLN: INTRAVENOUS | Status: DC

## 2020-03-11 MED ORDER — GLYCOPYRROLATE 0.2 MG/ML IJ SOLN: 0.2000 mg | INTRAMUSCULAR | Status: DC | PRN

## 2020-03-11 MED ORDER — GLYCOPYRROLATE 1 MG PO TABS: 1.0000 mg | ORAL_TABLET | ORAL | Status: DC | PRN

## 2020-04-05 NOTE — Progress Notes (Signed)
Daughter at bedside, Camillo Flaming, confirmed that pt's sister Gwenevere Ghazi, is Power of [place]. MD made aware and contact information in chart updated

## 2020-04-05 NOTE — Progress Notes (Signed)
Date and time results received: Mar 21, 2020 0545 (use smartphrase ".now" to insert current time)  Test: pCO2 Critical Value: 127  Name of Provider Notified: E-Link   Orders Received? Or Actions Taken?:None at this time.

## 2020-04-05 NOTE — Progress Notes (Signed)
Nimbex stopped at 1250 and 4/4 twitches noted on 3 amps. MD aware

## 2020-04-05 NOTE — Discharge Summary (Signed)
DISCHARGE SUMMARY    Date of admit: 03/08/2020  6:56 PM Date of discharge: April 04, 2020  5:22 PM Length of Stay: 4 days  PCP is Myrlene Broker, MD  CAUSE(S) OF DEATH  COVID-19   PROBLEM LIST Active Problems:   Acute respiratory failure ([place])   AKI (acute kidney injury) ([place])   Pneumonia due to COVID-19 virus   Acute respiratory distress syndrome (ARDS) due to COVID-19 virus ([place])   DNR (do not resuscitate)   Terminal care   Shock circulatory ([place])    SUMMARY Janet Dean was 75 y.o. patient with    has a past medical history of Anxiety, Arthritis, Asthma, GERD (gastroesophageal reflux disease), Gout, High blood pressure, Hypothyroidism, Kidney disease, and Tobacco abuse.   has a past surgical history that includes Sinoscopy (1992); Tubal ligation; Other surgical history (1988); and Ankle fracture surgery (2021).   Admitted on 03/05/2020 with    Patient with 75 years old COVID-19 infection bilateral pneumonia intubated to 3 days ago here with family request for higher level of care already asked had a CAT scan at the other hospital with no PE and fibrosis bullae and emphysema she received remdesivir and Actemra she has multiple allergies.     EVENTS   03/08/20 - GOC: 03/08/2020 - Long conversation explaining condition. Given age, multi-system organ failure, and now 2+ weeks from initial infection the prognosis is grave. Almost certainly will die this admission. Discussed with daughter and her sister who is HCPOA. HCPOA does nto want her to suffer and says patient would not want all of this. Daughter agrees would not want prolonged life support. Re-evaluate in 48 hours, sooner if worsens. Recommend DNR which is agreed to, CODE STATUS changed. Recommend comfort care but wanting to wait a bit longer to see if there is improvement. Re-address 03/10/20.  03/10/20 - NAEON. Worsening CO@, kidney function seems to be improving.   2020/04/04 -desaturating pulse ox 55%.  100% FiO2.   ET tube 8 cm above the carina and now repositioned.  Worsening creatinine hypotensive.  On Levophed increased needs from 7 mcg to 25 mcg..  Also on Diprivan infusion, fentanyl infusion, bicarb infusion and Nimbex infusion.  BIS score in the 20s  Nimbex stopped. TOF ensured. Terminal wean done after family goals of care and patient expired 04-04-20     SIGNED Dr. Brand Males, M.D., F.C.C.P Pulmonary and Critical Care Medicine Staff Physician [facility] Pager: [phone], If no answer or between  15:00h - 7:00h: call [phone]  03/13/2020 9:59 AM

## 2020-04-05 NOTE — Progress Notes (Signed)
Nutrition Follow-up  DOCUMENTATION CODES:   Obesity unspecified  INTERVENTION:  - dependent on medical course today, will change TF regimen 3/8, if warranted.   NUTRITION DIAGNOSIS:   Increased nutrient needs related to acute illness,catabolic illness ([id number]-39 infection) as evidenced by estimated needs. -ongoing  GOAL:   Provide needs based on ASPEN/SCCM guidelines -to be met with TF regimen  MONITOR:   Vent status,TF tolerance,Labs,Weight trends  REASON FOR ASSESSMENT:   Consult Enteral/tube feeding initiation and management  ASSESSMENT:   75 years old female with medical history of asthma, gout, hypothyroidism, anxiety, HTN, arthritis, GERD, kidney disease, and tobacco abuse. She was admitted from [facility] with COVID-19 infection, bilateral PNA, ARDS. She was intubated at [facility]. Family requested higher level of care.  Significant Events: 3/3- intubation; OGT placement; transferred from [facility] to [facility] 3/4- initial RD assessment 3/5- TF intiation    Patient discussed in rounds this AM.  Patient remains intubated with OGT in place. She was started on TF regimen per TF protocol on 3/5: Vital High Protein @ 40 ml/hr with 45 ml Prosource TF BID. This regimen is providing 1040 kcal, 106 grams protein, and 802 ml free water.   She has not been weighed since 3/3. Mild pitting edema to all extremities documented in the edema section of flow sheet.   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   Medications reviewed; 20 mg pepcid per OGT/day, sliding  scale novolog.   IVF; 150 mEq sodium bicarb-sterile water @ 125 ml/hr. Drips; nimbex @ 1.5 mcg/kg/min, fentanyl @ 75 mcg/hr, levo @ 32 mcg/min, propofol @ 10 mcg/kg/min   Diet Order:   Diet Order    None      EDUCATION NEEDS:   Not appropriate for education at this time  Skin:  Skin Assessment: Skin Integrity Issues: Skin Integrity Issues:: Other (Comment) Other: MASD and skin tear to buttocks  Last BM:  PTA/unknown  Height:   Ht Readings from Last 1 Encounters:  03/12/2020 5' 2"  (1.575 m)    Weight:   Wt Readings from Last 1 Encounters:  03/12/2020 81.7 kg    Ideal Body Weight:  50 kg  BMI:  Body mass index is 32.93 kg/m.  Estimated Nutritional Needs:   Kcal:  1700-1890 kcal (27-30 kcal/kg adjBW)  Protein:  110-125 grams (1.35-1.55 grams/kg actual weight)  Fluid:  >/= 2.2 L/day     Jarome Matin, MS, RD, LDN, CNSC Inpatient Clinical Dietitian RD pager # available in [facility]  After hours/weekend pager # available in [facility]

## 2020-04-05 NOTE — Progress Notes (Signed)
eLink Physician-Brief Progress Note Patient Name: Janet Dean DOB: 09-Oct-1945 MRN: [id number]   Date of Service  Mar 24, 2020  HPI/Events of Note  Called for hypotension and hypoxemia in this patient who is mechanically ventilated for COVID-19 pneumonia.  Sats are reading in 60s. MAP 59.   eICU Interventions  Ordered STAT CXR and ABG.     Intervention Category Major Interventions: Shock - evaluation and management;Respiratory failure - evaluation and management  Charlott Rakes 03-24-20, 4:45 AM

## 2020-04-05 NOTE — Progress Notes (Signed)
Pt went asystole on monitor at 1407. Death verified by Clarene Critchley RN and Jerene Pitch Rn. No lung or heart sounds auscultated. MD and daughter notified. All lines, tubes, and drains removed

## 2020-04-05 NOTE — Progress Notes (Signed)
   Took a call from the sister who said she is the DPOA at that point in time this sister was not listed in the [facility].  Her name is Gwenevere Ghazi.  The nurse confirmed to me that indeed the sister Gwenevere Ghazi is the [place].  And the daughters are not the DPOA.  The daughters were at the bedside and the daughters did confirm to the bedside nurse that Gwenevere Ghazi is the [place].  The nurses no updated Gwenevere Ghazi is the [place] in the EMR.  Dr. Silas Flood did talk to the sister earlier.  Sister confirmed desire for terminal wean.  I discussed the terminal wean process but she says she is already aware of this because of prior experience with other patients.  She asked for terminal wean as soon as possible  Plan -Stop Nimbex first and reassess in 90-120 minutes for train-of-four before initiating terminal wean orders     SIGNATURE    Dr. Brand Males, M.D., F.C.C.P,  Pulmonary and Critical Care Medicine Staff Physician, [place] Director - Interstitial Lung Disease  Program  Pulmonary [place] [facility] [place], [place], [address]  Pager: [phone], If no answer  OR between  19:00-7:00h: page [phone] Telephone (clinical office): [phone] Telephone (research): [phone]  12:37 PM 04-08-20

## 2020-04-05 NOTE — Procedures (Signed)
Extubation Procedure Note  Patient Details:   Name: Janet Dean DOB: 1945-12-23 MRN: [id number]   Airway Documentation:    Vent end date: 2020/03/21 Vent end time: 1411   Evaluation  O2 sats: N/A Complications: none Patient tolerated procedure well. Bilateral Breath Sounds: absent   Pt has expired  Rhett Bannister S 2020-03-21, 2:13 PM

## 2020-04-05 NOTE — Progress Notes (Signed)
NAME:  Janet Dean, MRN:  [id number], DOB:  04/19/1945, LOS: 4 ADMISSION DATE:  03/05/2020, CONSULTATION DATE: March 07, 2020 REFERRING MD: [facility], CHIEF COMPLAINT: ARDS COVID-19 infection  Brief History:   Patient with 75 years old COVID-104 infection bilateral pneumonia intubated to 3 days prior to admission -  here with family request for higher level of care already asked had a CAT scan at the other hospital with no PE and fibrosis bullae and emphysema she received remdesivir and Actemra she has multiple allergies.  Past Medical History:  COPD emphysema hypertension hyperlipidemia   SUBJECTIVE/OVERNIGHT/INTERVAL HX   2020-04-08 -desaturating pulse ox 55%.  100% FiO2.  ET tube 8 cm above the carina and now repositioned.  Worsening creatinine hypotensive.  On Levophed increased needs from 7 mcg to 25 mcg..  Also on Diprivan infusion, fentanyl infusion, bicarb infusion and Nimbex infusion.  BIS score in the 20s   Objective   Blood pressure (!) 93/49, pulse 96, temperature 99.5 F (37.5 C), temperature source Bladder, resp. rate (!) 35, height 5\' 2"  (1.575 m), weight 81.7 kg, SpO2 (!) 53 %.    Vent Mode: PRVC FiO2 (%):  [100 %] 100 % Set Rate:  [35 bmp] 35 bmp Vt Set:  [300 mL] 300 mL PEEP:  [16 cmH20] 16 cmH20 Plateau Pressure:  [24 cmH20] 24 cmH20   Intake/Output Summary (Last 24 hours) at 2020-04-08 0914 Last data filed at 04/08/20 0109 Gross per 24 hour  Intake 3411.55 ml  Output 412 ml  Net 2999.55 ml   Filed Weights   04/01/2020 2102  Weight: 81.7 kg     General Appearance:  Looks criticall ill On ventilato Head:  Normocephalic, without obvious abnormality, atraumatic Eyes:  PERRL - yes, conjunctiva/corneas - muddy     Ears:  Normal external ear canals, both ears Nose:  G tube - x Throat:  ETT TUBE - yes , OG tube - x Neck:  Supple,  No enlargement/tenderness/nodules Lungs: Clear to auscultation bilaterally, Ventilator   Synchrony - yes, Fio2 100% Heart:   S1 and S2 normal, no murmur, CVP - x.  Pressors - levophd + Abdomen:  Soft, no masses, no organomegaly Genitalia / Rectal:  Not done Extremities:  Extremities- intact Skin:  ntact in exposed areas . Sacral area - not examined Neurologic:  Sedation - fent gtt, diprivan tt -> RASS - -5/BIS 20s . Moves all 4s - no. CAM-ICU - x . Orientation - not    Assessment & Plan:  COVID-19 infection with severe ARDS: P:F ratio in 30s. PCO2 high. Likely developing fibrosis. ----No more proning (only mild improvement P:F ratio), continue paralytic --s/p actemra, stop steroids, received prior during ~10 day hospitalization at [place]    08-Apr-2020 - > does not meet criteria for SBT/Extubation in setting of Acute Respiratory Failure due to severe ARDS and shock, Nimbex, deep sedation with paralsyuis and renal failure. Hypoxemia is worse and unchanged since advancing ET tube  Plan  - PRVC - no proning per earlier decision   Acute renal failure with oliguria: due to hypoxemia, low BP, ATN. Causing metabolic acidosis.  04/08/20 - made some urine. Bic and K holding  Plan --receiving IV fluids --Not dialysis candidate  Shock: sedation, hypovolemic.   \2020/04/08 - Increasing levophed needs. No fever. Dropping wbc  Plan --IVF --MAP > 65, vasopressors as needed --Cefepime  DM: Hyperglycemic on steroids --SSI   Anemia of critical  Illness  2020/04/08  - no bleeding  Plan  - -  PRBC for hgb </= 6.9gm%    - exceptions are   -  if ACS susepcted/confirmed then transfuse for hgb </= 8.0gm%,  or    -  active bleeding with hemodynamic instability, then transfuse regardless of hemoglobin value   At at all times try to transfuse 1 unit prbc as possible with exception of active hemorrhage    GOC: 03/08/2020 - Long conversation explaining condition. Given age, multi-system organ failure, and now 2+ weeks from initial infection the prognosis is grave. Almost certainly will die this admission. Discussed  with daughter and her sister who is HCPOA. HCPOA does nto want her to suffer and says patient would not want all of this. Daughter agrees would not want prolonged life support. Re-evaluate in 48 hours, sooner if worsens. Recommend DNR which is agreed to, CODE STATUS changed. Recommend comfort care but wanting to wait a bit longer to see if there is improvement. Re-address 03/10/20.  2020-03-26 - spoke to [place] - explained lungs, BP, kidneys all failing.  Daughter Celene Skeen says she and patient other daughter are on way back. She wants permission for both daughters to visit. Medically appropriate as I believe patient is dying and informed daughter as such    [address]   The patient Janet Dean is critically ill with multiple organ systems failure and requires high complexity decision making for assessment and support, frequent evaluation and titration of therapies, application of advanced monitoring technologies and extensive interpretation of multiple databases.   Critical Care Time devoted to patient care services described in this note is  35  Minutes. This time reflects time of care of this signee Dr Brand Males. This critical care time does not reflect procedure time, or teaching time or supervisory time of PA/NP/Med student/Med Resident etc but could involve care discussion time     Dr. Brand Males, M.D., [facility]C.P Pulmonary and Critical Care Medicine Staff Physician [place] [facility] Pager: [phone], If no answer or between  15:00h - 7:00h: call [phone]  2020-03-26 9:14 AM     LABS    PULMONARY Recent Labs  Lab 03/08/20 0750 03/08/20 1154 03/08/20 1530 03/10/20 0505 03/26/20 0457  PHART 7.185* 7.186* 7.220* 7.252* 7.259*  PCO2ART 79.1* 83.5* 78.4* 105* 127*  PO2ART 53.2* 61.4* 80.0* 64.9* 41.0*  HCO3 28.7* 30.3* 30.9* 44.7* 54.4*  O2SAT 86.5 89.3 94.4 88.4 64.3    CBC Recent Labs  Lab  03/08/20 0440 03/09/20 0635 26-Mar-2020 0429  HGB 12.9 12.9 10.2*  HCT 43.5 41.3 34.1*  WBC 21.9* 19.7* 9.0  PLT 469* 432* 260    COAGULATION No results for input(s): INR in the last 168 hours.  CARDIAC  No results for input(s): TROPONINI in the last 168 hours. No results for input(s): PROBNP in the last 168 hours.   CHEMISTRY Recent Labs  Lab 03/08/20 0440 03/08/20 1207 03/09/20 0635 03/10/20 0856 03-26-2020 0429  NA 135 136 140 142 140  K 5.3* 4.8 4.3 4.2 4.4  CL 99 92* 88* 78* 75*  CO2 23 28 32 44* 45*  GLUCOSE 138* 235* 106* 200* 180*  BUN 85* 86* 94* 115* 141*  CREATININE 4.50* 4.37* 4.11* 3.91* 4.16*  CALCIUM 7.7* 7.4* 7.4* 6.8* 7.0*  MG 3.0*  --  3.1*  --  3.3*   Estimated Creatinine Clearance: 12.1 mL/min (A) (by C-G formula based on SCr of 4.16 mg/dL (H)).   LIVER Recent Labs  Lab 03/30/2020 2104 03/08/20 0440 03/09/20 0635  AST 79* 73* 111*  ALT 67* 60* 86*  ALKPHOS 93 82 91  BILITOT 1.4* 1.5* 1.5*  PROT 7.0 6.3* 6.9  ALBUMIN 3.2* 2.6* 2.4*     INFECTIOUS Recent Labs  Lab 03/06/2020 2104 03/08/20 0440 03/09/20 0510  LATICACIDVEN 1.6  --   --   PROCALCITON 0.75 2.84 4.28     ENDOCRINE CBG (last 3)  Recent Labs    2020-04-03 0022 2020/04/03 0406 03-Apr-2020 0738  GLUCAP 166* 160* 182*         IMAGING x48h  - image(s) personally visualized  -   highlighted in bold DG Chest Port 1 View  Result Date: 03-Apr-2020 CLINICAL DATA:  Pneumothorax EXAM: PORTABLE CHEST 1 VIEW COMPARISON:  03/08/2020. FINDINGS: Endotracheal tube seen 8.2 cm above the carina. Right subclavian central venous catheter tip noted within the superior vena cava. Nasogastric tube extends into the upper abdomen. There is progressive diffuse interstitial and airspace infiltrate demonstrating a basilar predominance suggestive of progressive moderate to severe pulmonary edema, though infection could appear similarly. Bibasilar opacification has developed likely reflecting the presence  of a small bilateral pleural effusions, right greater than left. No pneumothorax IMPRESSION: Stable support lines and tubes. Marked interval progression of diffuse pulmonary infiltrate demonstrating a basilar predominance with now development of a small bilateral pleural effusions most suggestive of developing moderate to severe pulmonary edema, possibly cardiogenic in nature. Electronically Signed   By: Fidela Salisbury MD   On: Apr 03, 2020 05:24

## 2020-04-05 NOTE — Progress Notes (Signed)
ETT advanced from 23cm to 27cm.

## 2020-04-05 NOTE — TOC Progression Note (Signed)
Transition of Care [facility]) - Progression Note    Patient Details  Name: Janet Dean MRN: [id number] Date of Birth: 1945-08-04  Transition of Care [facility]) CM/SW Contact  Leeroy Cha, RN Phone Number: 03-20-2020, 9:25 AM  Clinical Narrative:    Assessment & Plan:  [id number] infection with severe ARDS: P:F ratio in 57s. PCO2 high and climbing. Likely developing fibrosis, worsening dead space, ominous. --Full vent support, 6 cc/kg, PEEP 16, FiO2 100%, paralyzed then proned 3/4 during day with mild improvement in SpO2 --No more proning (only mild improvement P:F ratio), continue paralytic --s/p actemra, stop steroids, received prior during ~10 day hospitalization at [place]   Acute renal failure with oliguria: due to hypoxemia, low BP, ATN. Causing metabolic acidosis. Improving. --receiving IV fluids --Not dialysis candidate  Shock - resolved: sedation, hypovolemic. --IVF --MAP > 65, vasopressors as needed --Cefepime  DM: Hyperglycemic on steroids --SSI  GOC: 03/08/2020 - Long conversation explaining condition. Given age, multi-system organ failure, and now 2+ weeks from initial infection the prognosis is grave. Almost certainly will die this admission. Discussed with daughter and her sister who is HCPOA. HCPOA does nto want her to suffer and says patient would not want all of this. Daughter agrees would not want prolonged life support. Re-evaluate in 48 hours, sooner if worsens. Recommend DNR which is agreed to, CODE STATUS changed. Recommend comfort care but wanting to wait a bit longer to see if there is improvement. Re-address 03/10/20. PLAN: following for toc needs and goals of care.   Expected Discharge Plan: Home/Self Care Barriers to Discharge: Continued Medical Work up  Expected Discharge Plan and Services Expected Discharge Plan: Home/Self Care   Discharge Planning Services: CM Consult   Living arrangements for the past 2 months: Single Family Home                                        Social Determinants of Health (SDOH) Interventions    Readmission Risk Interventions No flowsheet data found.

## 2020-04-05 DEATH — deceased

## 2022-01-05 IMAGING — DX DG CHEST 1V PORT
1 series · 1 of 1 positions shown · non-contrast
Comparison: 03/07/2020 at [DATE] p.m.

CLINICAL DATA: Acute respiratory failure, intubated

EXAM:
PORTABLE CHEST 1 VIEW

[chest ap]
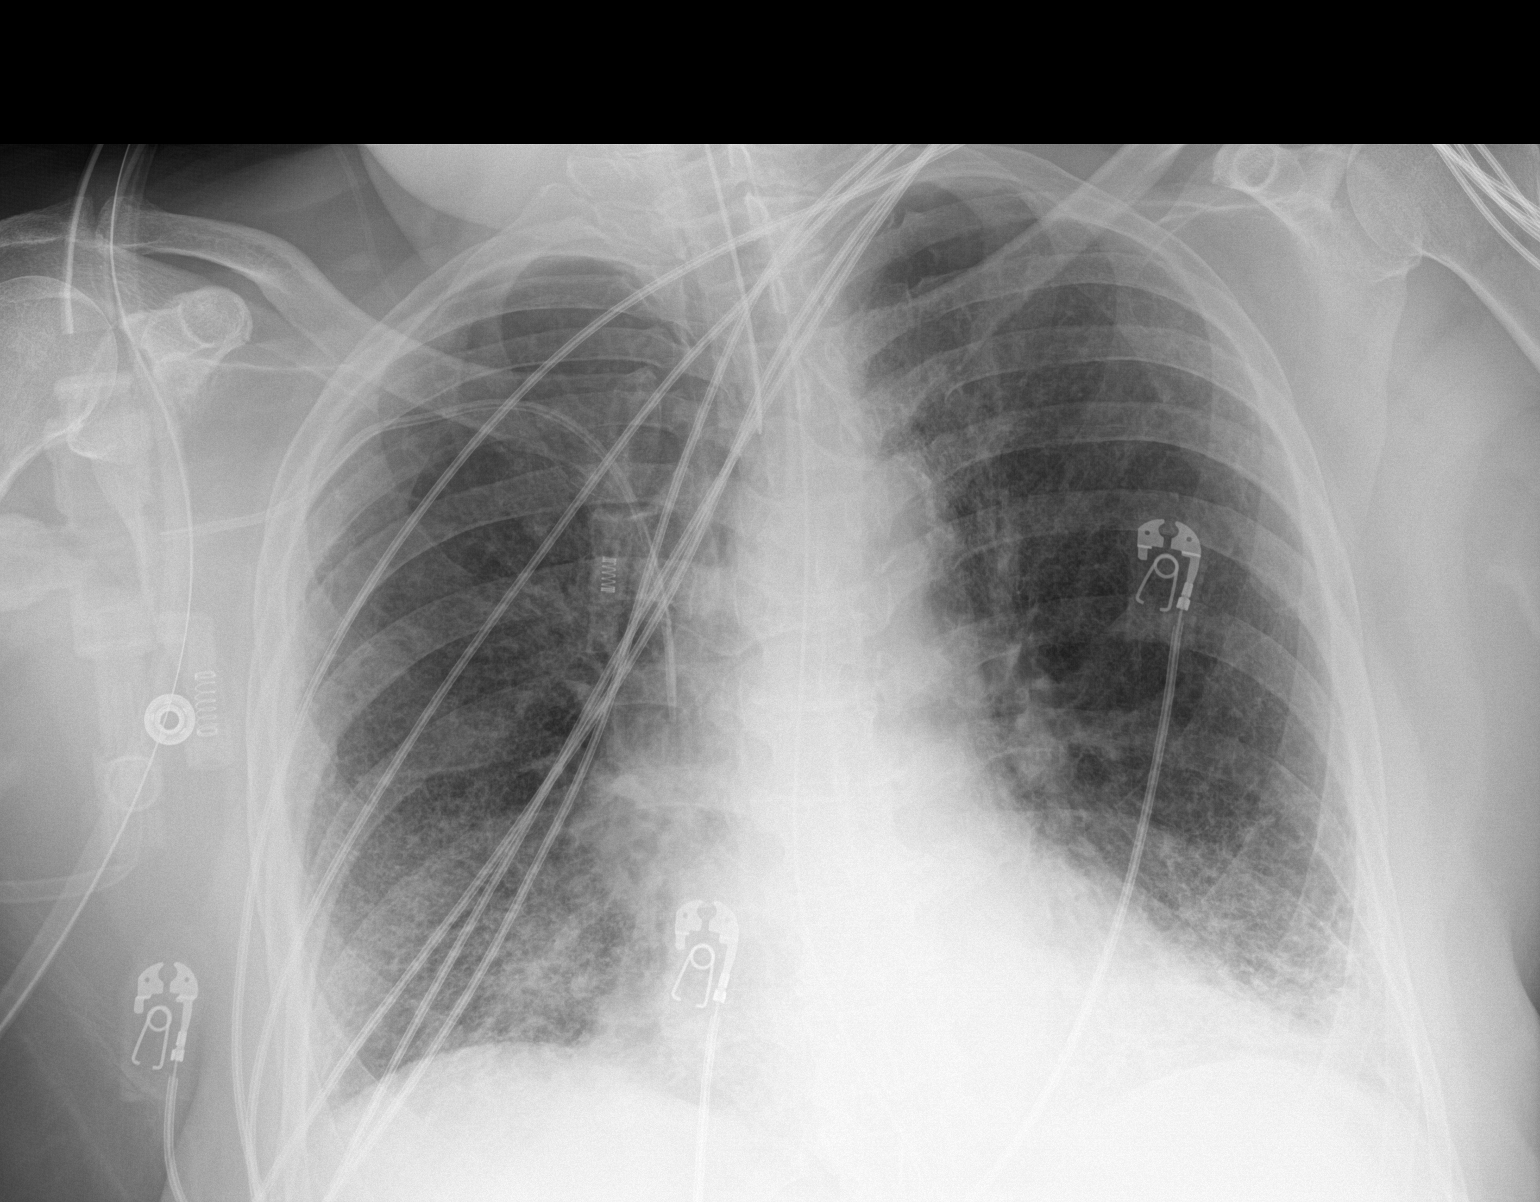

[1 of 1 positions shown; findings below may reference images not displayed]

FINDINGS: Single frontal view of the chest demonstrates endotracheal tube
overlying tracheal air column tip at thoracic inlet. Enteric
catheter passes below diaphragm tip excluded by collimation. Right
subclavian central venous catheter tip overlies superior vena cava.
The cardiac silhouette is stable. Bibasilar interstitial and
ground-glass opacities have progressed slightly in the interim. No
large effusion or pneumothorax.
IMPRESSION: 1. Persistent bibasilar interstitial and ground-glass opacities
consistent with edema or infection.

## 2022-01-06 IMAGING — DX DG CHEST 1V PORT
1 series · 1 of 1 positions shown · non-contrast
Comparison: 03/07/2020

CLINICAL DATA: Hypoxia, hypertension

EXAM:
PORTABLE CHEST 1 VIEW

[chest ap]
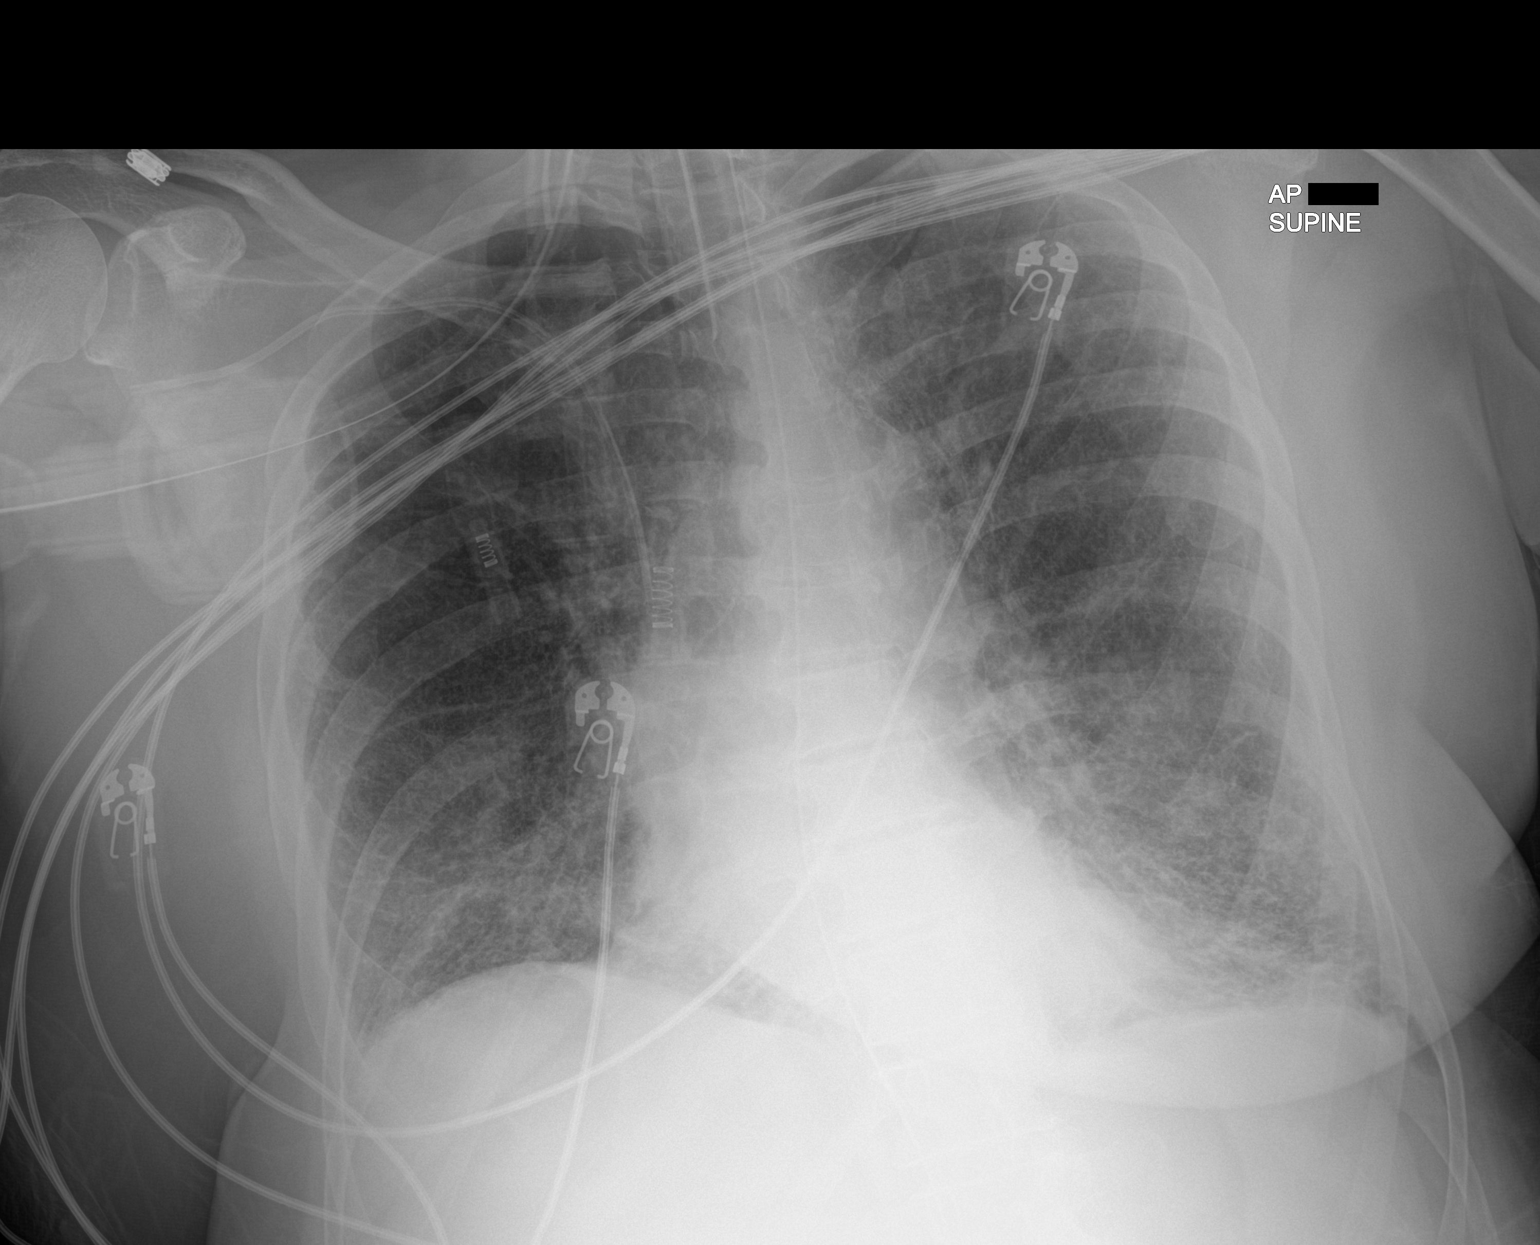

[1 of 1 positions shown; findings below may reference images not displayed]

FINDINGS: Endotracheal tube is seen 6.1 cm above the carina. Nasogastric tube
extends into the upper abdomen. Right subclavian central venous
catheter tip noted within the superior vena cava.

Pulmonary insufflation has slightly diminished since prior
examination. Bibasilar pulmonary infiltrates persist, stable. No
pneumothorax or pleural effusion. Cardiac size within normal limits.
IMPRESSION: Slight interval decrease in pulmonary insufflation. Stable support
lines and tubes. Stable bibasilar pulmonary infiltrates.

## 2022-01-09 IMAGING — DX DG CHEST 1V
1 series · 1 of 1 positions shown · non-contrast
Comparison: Chest x-ray 03/11/2020.

CLINICAL DATA: 74-year-old female with history of intubation.
Respiratory distress.

EXAM:
CHEST  1 VIEW

[chest ap]
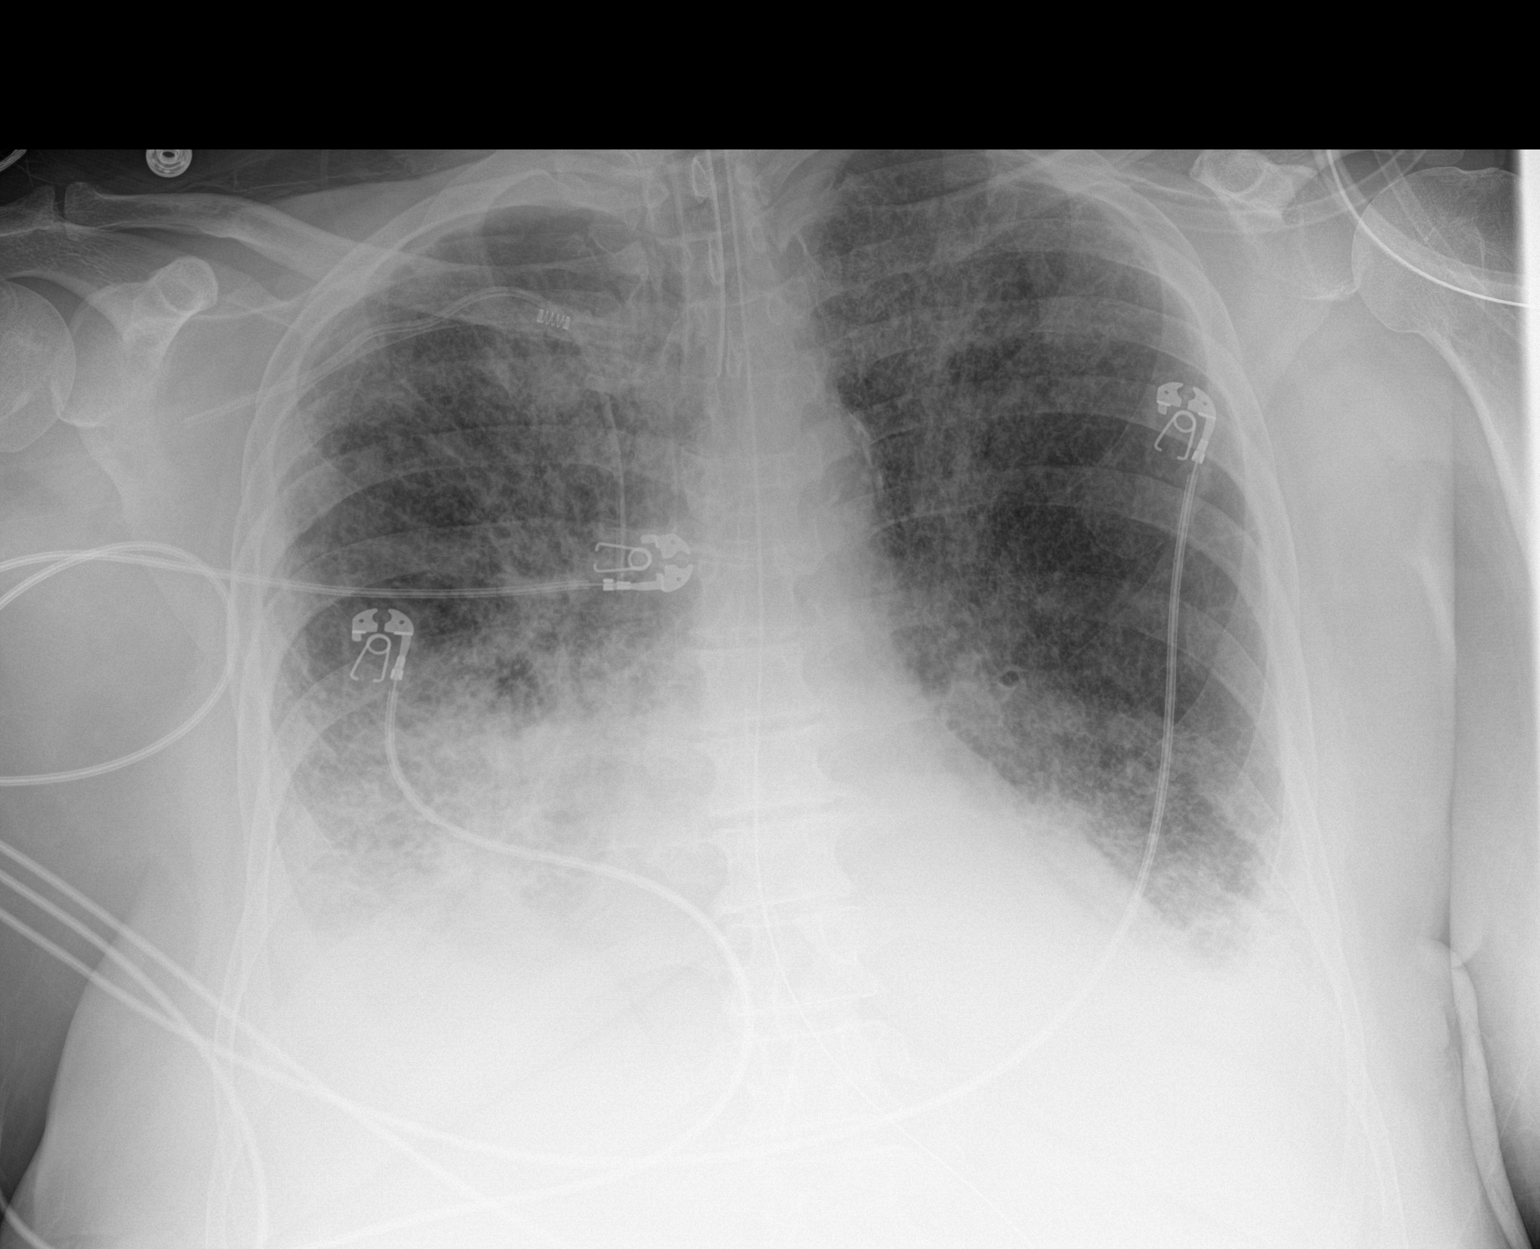

[1 of 1 positions shown; findings below may reference images not displayed]

FINDINGS: An endotracheal tube is in place with tip 5.2 cm above the carina.
There is a right-sided subclavian central venous catheter with tip
terminating in the mid superior vena cava. A nasogastric tube is
seen extending into the stomach, however, the tip of the nasogastric
tube extends below the lower margin of the image. Lung volumes are
low. Widespread patchy areas of interstitial prominence an
ill-defined airspace disease scattered throughout the lungs
bilaterally, asymmetrically distributed, but most confluent in the
right lung base. Small bilateral pleural effusions. Overall,
aeration is very similar to the prior study, although lung volumes
have decreased. No pneumothorax. Pulmonary vasculature is largely
obscured. Heart size appears normal. Upper mediastinal contours are
within normal limits. Aortic atherosclerosis.
IMPRESSION: 1.   1. Support apparatus, as above.
2. Severe multilobar bilateral pneumonia with bilateral pleural
effusions, similar to the prior examination.
